# Patient Record
Sex: Male | Born: 1945 | Hispanic: Yes | Marital: Married | Smoking: Never smoker
Health system: Southern US, Community
[De-identification: ages and names within clinical notes are randomized; demographics above are authoritative.]

## PROBLEM LIST (undated history)

## (undated) DIAGNOSIS — K7460 Unspecified cirrhosis of liver: Secondary | ICD-10-CM

## (undated) DIAGNOSIS — E119 Type 2 diabetes mellitus without complications: Secondary | ICD-10-CM

## (undated) DIAGNOSIS — I10 Essential (primary) hypertension: Secondary | ICD-10-CM

## (undated) HISTORY — PX: APPENDECTOMY: SHX54

---

## 2020-08-04 DIAGNOSIS — E1165 Type 2 diabetes mellitus with hyperglycemia: Secondary | ICD-10-CM | POA: Insufficient documentation

## 2020-09-19 ENCOUNTER — Other Ambulatory Visit (HOSPITAL_COMMUNITY): Payer: Self-pay | Admitting: Physician Assistant

## 2020-09-19 DIAGNOSIS — R4182 Altered mental status, unspecified: Secondary | ICD-10-CM

## 2020-09-19 DIAGNOSIS — R2681 Unsteadiness on feet: Secondary | ICD-10-CM

## 2020-12-04 ENCOUNTER — Encounter (HOSPITAL_COMMUNITY): Payer: Self-pay | Admitting: Emergency Medicine

## 2020-12-04 ENCOUNTER — Other Ambulatory Visit: Payer: Self-pay

## 2020-12-04 ENCOUNTER — Ambulatory Visit (HOSPITAL_COMMUNITY)
Admission: EM | Admit: 2020-12-04 | Discharge: 2020-12-04 | Disposition: A | Discharge status: Home / Self Care | Payer: Medicare Other

## 2020-12-04 DIAGNOSIS — R195 Other fecal abnormalities: Secondary | ICD-10-CM

## 2020-12-04 DIAGNOSIS — K529 Noninfective gastroenteritis and colitis, unspecified: Secondary | ICD-10-CM

## 2020-12-04 DIAGNOSIS — I8500 Esophageal varices without bleeding: Secondary | ICD-10-CM | POA: Diagnosis not present

## 2020-12-04 DIAGNOSIS — R110 Nausea: Secondary | ICD-10-CM

## 2020-12-04 DIAGNOSIS — K7460 Unspecified cirrhosis of liver: Secondary | ICD-10-CM

## 2020-12-04 DIAGNOSIS — R197 Diarrhea, unspecified: Secondary | ICD-10-CM

## 2020-12-04 HISTORY — DX: Type 2 diabetes mellitus without complications: E11.9

## 2020-12-04 HISTORY — DX: Unspecified cirrhosis of liver: K74.60

## 2020-12-04 HISTORY — DX: Essential (primary) hypertension: I10

## 2020-12-04 MED ORDER — LOPERAMIDE HCL 2 MG PO CAPS: 2.0000 mg | ORAL_CAPSULE | Freq: Two times a day (BID) | ORAL | 0 refills | Status: DC | PRN

## 2020-12-04 NOTE — ED Triage Notes (Signed)
Pt is present today with dark stools and abdominal pain, diarrhea, nausea that started two days ago

## 2020-12-04 NOTE — ED Provider Notes (Signed)
[facility]   MRN: [id number] DOB: [birth year]  Subjective:   Anthany Thornhill Bradly Bienenstock is a 75 y.o. male presenting for 2 day history of acute onset nausea without vomiting, 3-4 bouts of diarrhea. Patient actually had an accident today at his daughter's home. She noticed dark stool. Was not profuse. Has not happened again. No history of GI bleeding.  Had an endoscopy and colonoscopy in July. Has liver cirrhosis, esophageal varices. Is followed closely by his GI specialist.  Has follow-up at the end of this month.  No chest pain, shortness of breath, fevers, frank bloody stools.  No perianal pain, history of hemorrhoids.  No current facility-administered medications for this encounter.  Current Outpatient Medications:    carvedilol (COREG) 3.125 MG tablet, Take by mouth., Disp: , Rfl:    furosemide (LASIX) 80 MG tablet, Take 1 tablet by mouth daily., Disp: , Rfl:    Insulin Pen Needle (BD PEN NEEDLE NANO U/F) 32G X 4 MM MISC, 1 Units by Misc.(Non-Drug; Combo Route) route daily., Disp: , Rfl:    spironolactone (ALDACTONE) 100 MG tablet, Take 1 tablet by mouth daily., Disp: , Rfl:    Cholecalciferol 25 MCG (1000 UT) capsule, Take by mouth., Disp: , Rfl:    LANTUS SOLOSTAR 100 UNIT/ML Solostar Pen, Inject into the skin., Disp: , Rfl:    PARoxetine (PAXIL) 10 MG tablet, Take 10 mg by mouth daily., Disp: , Rfl:    No Known Allergies  Past Medical History:  Diagnosis Date   Cirrhosis (HCC)    Diabetes mellitus without complication (HCC)    Hypertension      History reviewed. No pertinent surgical history.  No family history on file.  Social History   Tobacco Use   Smoking status: Never   Smokeless tobacco: Never  Vaping Use   Vaping Use: Never used  Substance Use Topics   Alcohol use: Never   Drug use: Never    ROS   Objective:   Vitals: BP 115/73   Pulse 83   Temp (!) 97.5 F (36.4 C)   Resp 17   SpO2 99%   Physical Exam Constitutional:       General: He is not in acute distress.    Appearance: Normal appearance. He is well-developed and normal weight. He is not ill-appearing, toxic-appearing or diaphoretic.  HENT:     Head: Normocephalic and atraumatic.     Right Ear: External ear normal.     Left Ear: External ear normal.     Nose: Nose normal.     Mouth/Throat:     Mouth: Mucous membranes are moist.     Pharynx: Oropharynx is clear.  Eyes:     General: No scleral icterus.       Right eye: No discharge.        Left eye: No discharge.     Extraocular Movements: Extraocular movements intact.     Pupils: Pupils are equal, round, and reactive to light.  Cardiovascular:     Rate and Rhythm: Normal rate and regular rhythm.     Heart sounds: Normal heart sounds. No murmur heard.   No friction rub. No gallop.  Pulmonary:     Effort: Pulmonary effort is normal. No respiratory distress.     Breath sounds: Normal breath sounds. No stridor. No wheezing, rhonchi or rales.  Abdominal:     General: Bowel sounds are normal. There is no distension.     Palpations: Abdomen is soft. There is  no mass.     Tenderness: There is no abdominal tenderness. There is no right CVA tenderness, left CVA tenderness, guarding or rebound.  Musculoskeletal:     Cervical back: Normal range of motion.  Neurological:     Mental Status: He is alert and oriented to person, place, and time.  Psychiatric:        Mood and Affect: Mood normal.        Behavior: Behavior normal.        Thought Content: Thought content normal.        Judgment: Judgment normal.     Assessment and Plan :   PDMP not reviewed this encounter.  1. Colitis   2. Nausea   3. Diarrhea, unspecified type   4. Dark stools   5. Cirrhosis of liver without ascites, unspecified hepatic cirrhosis type (HCC)   6. Esophageal varices without bleeding, unspecified esophageal varices type (HCC)     Discussed possibility of upper GI bleed given his dark stool and recent endoscopy that  showed esophageal varices.  At the time it was seen without bleeding.  Will defer ER visit as patient is hemodynamically stable vital signs and an unremarkable physical exam.  Recommended follow-up with his GI specialist as soon as possible.  They plan on calling their office now. Counseled patient on potential for adverse effects with medications prescribed/recommended today, ER and return-to-clinic precautions discussed, patient verbalized understanding.    Wallis Bamberg, PA-C 12/04/20 1400

## 2020-12-25 ENCOUNTER — Other Ambulatory Visit: Payer: Self-pay

## 2020-12-25 ENCOUNTER — Ambulatory Visit: Payer: Medicare Other | Attending: Family Medicine | Admitting: Family Medicine

## 2020-12-25 ENCOUNTER — Encounter: Payer: Self-pay | Admitting: Family Medicine

## 2020-12-25 VITALS — BP 119/74 | HR 100 | Ht 67.0 in

## 2020-12-25 DIAGNOSIS — R29898 Other symptoms and signs involving the musculoskeletal system: Secondary | ICD-10-CM | POA: Diagnosis not present

## 2020-12-25 DIAGNOSIS — Z23 Encounter for immunization: Secondary | ICD-10-CM | POA: Diagnosis not present

## 2020-12-25 DIAGNOSIS — E1165 Type 2 diabetes mellitus with hyperglycemia: Secondary | ICD-10-CM | POA: Diagnosis not present

## 2020-12-25 DIAGNOSIS — M79604 Pain in right leg: Secondary | ICD-10-CM

## 2020-12-25 DIAGNOSIS — R296 Repeated falls: Secondary | ICD-10-CM

## 2020-12-25 DIAGNOSIS — E119 Type 2 diabetes mellitus without complications: Secondary | ICD-10-CM | POA: Insufficient documentation

## 2020-12-25 DIAGNOSIS — M79605 Pain in left leg: Secondary | ICD-10-CM | POA: Diagnosis not present

## 2020-12-25 DIAGNOSIS — Z794 Long term (current) use of insulin: Secondary | ICD-10-CM

## 2020-12-25 LAB — GLUCOSE, POCT (MANUAL RESULT ENTRY): POC Glucose: 359 mg/dl — AB (ref 70–99)

## 2020-12-25 LAB — POCT GLYCOSYLATED HEMOGLOBIN (HGB A1C): Hemoglobin A1C: 11.8 % — AB (ref 4.0–5.6)

## 2020-12-25 MED ORDER — DULOXETINE HCL 60 MG PO CPEP
60.0000 mg | ORAL_CAPSULE | Freq: Every day | ORAL | 3 refills | Status: DC
  Filled 2020-12-25: qty 30, 30d supply, fill #0
  Filled 2021-01-22: qty 30, 30d supply, fill #1
  Filled 2021-02-28: qty 30, 30d supply, fill #0
  Filled 2021-02-28: qty 30, 30d supply, fill #2
  Filled 2021-04-01: qty 30, 30d supply, fill #1

## 2020-12-25 MED ORDER — LANTUS SOLOSTAR 100 UNIT/ML ~~LOC~~ SOPN
37.0000 [IU] | PEN_INJECTOR | Freq: Every day | SUBCUTANEOUS | 6 refills | Status: DC
  Filled 2020-12-25: qty 9, 24d supply, fill #0
  Filled 2020-12-25: qty 30, 81d supply, fill #0

## 2020-12-25 NOTE — Progress Notes (Signed)
Subjective:  Patient ID: Nicholas Gomez, male    DOB: June 19, 1945  Age: 75 y.o. MRN: [id number]  CC: Establish Care   HPI [facility] Edsel Petrin is a 75 y.o. year old male with a history of liver cirrhosis (followed by [facility] GI), previous alcohol abuse, esophageal varices here to establish care. Previously resided in [place] but relocated to [place] 7 months ago. Accompanied  by daughter.  Interval History: His last visit with the liver clinic was yesterday at [place] [facility] and notes reviewed from care everywhere.   His blood sugars have been around 300 at home; he is on Basaglar 30 units qhs.  Denies presence of hypoglycemia or visual concerns. He complains of pain in his legs describes as cramping pain. He has also been falling a lot. Daughter is unsure of duration of symptoms.  Denies presence of lower back pain. He was brought to [facility] because he has no one to stay with him at home in [place].  Past Medical History:  Diagnosis Date   Cirrhosis ([place])    Diabetes mellitus without complication ([place])    Hypertension     No past surgical history on file.  No family history on file.  No Known Allergies  Outpatient Medications Prior to Visit  Medication Sig Dispense Refill   carvedilol (COREG) 3.125 MG tablet Take by mouth.     Cholecalciferol 25 MCG (1000 UT) capsule Take by mouth.     furosemide (LASIX) 80 MG tablet Take 1 tablet by mouth daily.     Insulin Pen Needle (BD PEN NEEDLE NANO U/F) 32G X 4 MM MISC 1 Units by Misc.(Non-Drug; Combo Route) route daily.     LANTUS SOLOSTAR 100 UNIT/ML Solostar Pen Inject into the skin.     loperamide (IMODIUM) 2 MG capsule Take 1 capsule (2 mg total) by mouth 2 (two) times daily as needed for diarrhea or loose stools. 14 capsule 0   PARoxetine (PAXIL) 10 MG tablet Take 10 mg by mouth daily.     spironolactone (ALDACTONE) 100 MG tablet Take 1 tablet by mouth daily.     No facility-administered medications prior to  visit.     ROS Review of Systems  Constitutional:  Negative for activity change and appetite change.  HENT:  Negative for sinus pressure and sore throat.   Eyes:  Negative for visual disturbance.  Respiratory:  Negative for cough, chest tightness and shortness of breath.   Cardiovascular:  Negative for chest pain and leg swelling.  Gastrointestinal:  Negative for abdominal distention, abdominal pain, constipation and diarrhea.  Endocrine: Negative.   Genitourinary:  Negative for dysuria.  Musculoskeletal:  Negative for joint swelling and myalgias.  Skin:  Negative for rash.  Allergic/Immunologic: Negative.   Neurological:  Positive for weakness. Negative for light-headedness and numbness.  Psychiatric/Behavioral:  Negative for dysphoric mood and suicidal ideas.    Objective:  BP 119/74   Pulse 100   Ht 5\' 7"  (1.702 m)   SpO2 97%   BP/Weight 12/25/2020 [id number]  Systolic BP [id number]  Diastolic BP 74 73      Physical Exam Constitutional:      Appearance: He is well-developed.  Cardiovascular:     Rate and Rhythm: Normal rate.     Heart sounds: Normal heart sounds. No murmur heard. Pulmonary:     Effort: Pulmonary effort is normal.     Breath sounds: Normal breath sounds. No wheezing or rales.  Chest:     Chest  wall: No tenderness.  Abdominal:     General: Bowel sounds are normal. There is no distension.     Palpations: Abdomen is soft. There is no mass.     Tenderness: There is no abdominal tenderness.     Hernia: A hernia (umbilical) is present.     Comments: Ascites  Musculoskeletal:        General: Normal range of motion.     Right lower leg: No edema.     Left lower leg: No edema.  Neurological:     Mental Status: He is alert and oriented to person, place, and time.     Gait: Gait normal.     Comments: Normal strength in bilateral lower extremities  Psychiatric:        Mood and Affect: Mood normal.    Lab Results  Component Value Date   HGBA1C 11.8  (A) 12/25/2020    Assessment & Plan:  1. Type 2 diabetes mellitus with hyperglycemia, with long-term current use of insulin (HCC) Uncontrolled with A1c of 11.8 Increase Lantus from 30 units to 37 units Will review blood sugar log at next visit and adjust regimen accordingly - POCT glucose (manual entry) - POCT glycosylated hemoglobin (Hb A1C) - insulin glargine (LANTUS SOLOSTAR) 100 UNIT/ML Solostar Pen; Inject 37 Units into the skin at bedtime.  Dispense: 30 mL; Refill: 6  2. 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 For leg pains  Dispense: 30 capsule; Refill: 3  3. Weakness of both lower extremities Underlying myopathy Will benefit from PT  4. Falls frequently - Ambulatory referral to Physical Therapy  5. Need for pneumococcal vaccine - Pneumococcal conjugate vaccine 20-valent  6. Need for immunization against influenza - Flu Vaccine QUAD 62mo+IM (Fluarix, Fluzone & Alfiuria Quad PF)    No orders of the defined types were placed in this encounter.  Return in about 6 weeks (around 02/05/2021) for Diabetes.       Hoy Register, MD, FAAFP. [facility] [place], [place] [phone]   12/25/2020, 2:19 PM

## 2020-12-25 NOTE — Progress Notes (Signed)
[place] [id number]

## 2020-12-31 ENCOUNTER — Telehealth: Payer: Self-pay

## 2020-12-31 ENCOUNTER — Other Ambulatory Visit: Payer: Self-pay

## 2020-12-31 NOTE — Telephone Encounter (Signed)
Yes that is being prescribed by his GI at [facility] and he needs to clarify with them.

## 2020-12-31 NOTE — Telephone Encounter (Signed)
Pt is wanting to know if he is to still take Lasix and the dosage.

## 2021-01-01 ENCOUNTER — Other Ambulatory Visit: Payer: Self-pay

## 2021-01-01 NOTE — Telephone Encounter (Signed)
Pt was called and informed to contact gastro doctor for medication dosage.

## 2021-01-03 ENCOUNTER — Other Ambulatory Visit: Payer: Self-pay

## 2021-01-22 ENCOUNTER — Other Ambulatory Visit: Payer: Self-pay

## 2021-01-22 DIAGNOSIS — K7031 Alcoholic cirrhosis of liver with ascites: Secondary | ICD-10-CM | POA: Insufficient documentation

## 2021-01-22 MED ORDER — FUROSEMIDE 80 MG PO TABS
ORAL_TABLET | ORAL | 5 refills | Status: DC
  Filled 2021-01-22 – 2021-03-13 (×3): qty 60, 30d supply, fill #0
  Filled 2021-04-12: qty 60, 30d supply, fill #1
  Filled 2021-04-15: qty 60, 30d supply, fill #0
  Filled 2021-05-20: qty 60, 30d supply, fill #1
  Filled 2021-06-20 (×2): qty 60, 30d supply, fill #2
  Filled 2021-08-15: qty 60, 30d supply, fill #3

## 2021-01-23 ENCOUNTER — Other Ambulatory Visit: Payer: Self-pay

## 2021-01-24 ENCOUNTER — Other Ambulatory Visit: Payer: Self-pay

## 2021-01-25 ENCOUNTER — Other Ambulatory Visit: Payer: Self-pay

## 2021-01-30 ENCOUNTER — Other Ambulatory Visit: Payer: Self-pay

## 2021-01-30 ENCOUNTER — Ambulatory Visit: Payer: Medicare Other | Attending: Family Medicine | Admitting: Physical Therapy

## 2021-01-30 DIAGNOSIS — R262 Difficulty in walking, not elsewhere classified: Secondary | ICD-10-CM

## 2021-01-30 DIAGNOSIS — R296 Repeated falls: Secondary | ICD-10-CM

## 2021-01-30 DIAGNOSIS — R260 Ataxic gait: Secondary | ICD-10-CM | POA: Diagnosis present

## 2021-01-30 DIAGNOSIS — M6281 Muscle weakness (generalized): Secondary | ICD-10-CM

## 2021-01-30 NOTE — Therapy (Signed)
[facility] [address]  [address] [place], [place], [address] Phone: [phone]   Fax:  [phone]  Physical Therapy Evaluation  Patient Details  Name: Nicholas Gomez MRN: [id number] Date of Birth: 08-24-1945 Referring Provider (PT): Hoy Register, MD   Encounter Date: 01/30/2021   PT End of Session - 01/30/21 1731     Visit Number 1    Number of Visits 16    Date for PT Re-Evaluation 03/27/21    Authorization Type Medicaid    Progress Note Due on Visit 10    PT Start Time 1550    PT Stop Time 1625    PT Time Calculation (min) 35 min    Equipment Utilized During Treatment Gait belt    Activity Tolerance Patient tolerated treatment well    Behavior During Therapy Nicholas Gomez for tasks assessed/performed;Impulsive             Past Medical History:  Diagnosis Date   Cirrhosis (HCC)    Diabetes mellitus without complication (HCC)    Hypertension     No past surgical history on file.  There were no vitals filed for this visit.    Subjective Assessment - 01/30/21 1552     Subjective Patient is here with his family today (daughter and daughter in law).  He fell yesterday and hit his head.   The cymbalta has helped with leg pain.  He feels very dizzy, has CT scan scheduled to check brain.    Patient is accompained by: Family member    Pertinent History history of liver cirrhosis, EtOH abuse, esophageal varices, Diabetes, HTN, umbilical hernia    Patient Stated Goals improve balance, stop falling    Currently in Pain? Yes    Pain Score 2     Pain Location Head                [facility] PT Assessment - 01/30/21 0001       Assessment   Medical Diagnosis R29.6 (ICD-10-CM) - Falls frequently    Referring Provider (PT) Hoy Register, MD    Next MD Visit 02/06/2021    Prior Therapy no      Precautions   Precautions Fall      Restrictions   Weight Bearing Restrictions No      Balance Screen   Has the  patient fallen in the past 6 months Yes    How many times? falls frequently, including yesterday    Has the patient had a decrease in activity level because of a fear of falling?  Yes    Is the patient reluctant to leave their home because of a fear of falling?  Yes      Home Environment   Living Environment Private residence    Living Arrangements Children   adult children     Prior Function   Level of Independence Independent    Vocation Retired      IT consultant   Overall Cognitive Status Impaired/Different from baseline    Area of Impairment Safety/judgement    Safety/Judgement Decreased awareness of safety;Decreased awareness of deficits    Behaviors Impulsive      Observation/Other Assessments   Observations Pt. enters today with daughter and daughter in law who provided translation.  They declined offer of medical translation.  He had SPC which used very inconsistently, gait was very unsteady, especially with turning.  Appeared alert and would answer questions and follow directions, but also very impulsive.  ROM / Strength   AROM / PROM / Strength Strength      Strength   Overall Strength Within functional limits for tasks performed    Overall Strength Comments 5/5 bil LE strength, tested in sitting      Ambulation/Gait   Ambulation/Gait Yes    Ambulation/Gait Assistance 4: Min assist    Ambulation/Gait Assistance Details with 4WRW and minA using gait belt, frequent path deviations    Ambulation Distance (Feet) 800 Feet    Assistive device 4-wheeled walker    Gait Pattern Ataxic    Ambulation Surface Level      6 minute walk test results    Aerobic Endurance Distance Walked 800    Endurance additional comments no DOE or signs of exertion, no complaint of fatigue      Balance   Balance Assessed Yes      Standardized Balance Assessment   Standardized Balance Assessment Timed Up and Go Test;Five Times Sit to Stand    Five times sit to stand comments  26 seconds  with UE support and SBA for safety      Timed Up and Go Test   TUG Normal TUG    Normal TUG (seconds) 26    TUG Comments modA needed to prevent fall with turning                        Objective measurements completed on examination: See above findings.                  PT Short Term Goals - 01/30/21 1743       PT SHORT TERM GOAL #1   Title Pt. will complete FOTO for balance    Time 1    Period Weeks    Status New    Target Date 02/06/21               PT Long Term Goals - 01/30/21 1744       PT LONG TERM GOAL #1   Title Pt. will be compliant with progressive HEP for LE strengthening/balance.    Time 8    Period Weeks    Status New    Target Date 03/27/21      PT LONG TERM GOAL #2   Title Pt. will demonstrate improved functional strength by completing 5x STS in <20 second safely.    Baseline 26 seconds with UE assist.    Time 8    Period Weeks    Status New    Target Date 03/27/21      PT LONG TERM GOAL #3   Title Pt. will be able to complete TUG safely in <25 seconds with appropriate AD and no LOB.    Baseline 22 seconds, however modA needed to prevent falls.  Pt. with poor awarenes of balance deficits.    Time 8    Period Weeks    Status New    Target Date 03/27/21      PT LONG TERM GOAL #4   Title Pt. will be complaint with use of appropriate AD and ambulate 100' safely with supervision.    Baseline inconsistent with AD use, ataxic, CGA needed for safety    Time 8    Period Weeks    Status New    Target Date 03/27/21                    Plan - 01/30/21 1732  Clinical Impression Statement [name] is a 75 year old male with significant medical history for liver cirrhosis, EtOH abuse, esophageal varices, Diabetes, HTN, and umbilical hernia, referred for frequent falls and deconditioning.  He has recently moved to area from [place] and accompanied by daughter and daughter in law today who provided  translation.  He fell yesterday and hit his head, but reports the pain is not too bad.  His leg pain has improved significantly with Cymbalta and is not a concern.  Today he demonstrates very ataxic gait and needed CGA at all times with gait belt to prevent falls.  He does not appear aware of his impairments, and also very impulsive, for example deciding to demonstrate to therapist exacty how he fell yesterday (tends to fall backwards when standing).  His LE strength is good, and today he was able to walk for 6 minutes without discomfort or any signs of exertion.  His 5x STS and TUG score place him at high risk for falls.  He would benefit from skilled physical therapy to improve balance and decrease fall risk.  Due to his fall risk, he would benefit from 24 hour supervision and 4WRW.  Family is working on obtaining [id number].  He has 2WRW at home but does not use and is not safe with it.  Issued gait belt today also and encouraged use of gait belt to patient especially walking outside to Dr. appt. with family, both for their safety and his.  Family would benefit from referral to social work, as daughter reports caregiver fatigue and difficulty with providing supervision.  Due to time (family arrived 15 min late to evaluation), was unable to assess dizziness today, but will monitor and further assess next session.  He also has brain CT scan scheduled for dizziness.    Personal Factors and Comorbidities Comorbidity 3+;Social Background;Age;Behavior Pattern    Comorbidities history of liver cirrhosis, EtOH abuse, esophageal varices, Diabetes, HTN, umbilical hernia    Examination-Activity Limitations Transfers;Bend;Lift;Locomotion Level;Stairs;Stand    Examination-Participation Restrictions Community Activity;Meal Prep;Shop;Cleaning;Laundry;Yard Work    Conservation officer, historic buildings Evolving/Moderate complexity    Clinical Decision Making Moderate    Rehab Potential Fair    PT Frequency 2x / week    PT  Duration 8 weeks    PT Treatment/Interventions ADLs/Self Care Home Management;Gait training;Stair training;Functional mobility training;Therapeutic activities;Therapeutic exercise;Balance training;Neuromuscular re-education;Patient/family education;Cryotherapy;Moist Heat;Vestibular    PT Next Visit Plan FOTO for balance, assess vestibular system/dizziness, mCTSIB, balance training    Recommended Other Services social work    [employer] and Agree with Plan of Care Patient;Family member/caregiver    Family Member Consulted daughter and daughter in law             Patient will benefit from skilled therapeutic intervention in order to improve the following deficits and impairments:  Abnormal gait, Decreased balance, Decreased mobility, Difficulty walking, Decreased knowledge of precautions, Dizziness, Pain, Decreased safety awareness, Decreased activity tolerance  Visit Diagnosis: Repeated falls  Ataxic gait  Difficulty in walking, not elsewhere classified  Muscle weakness (generalized)     Problem List Patient Active Problem List   Diagnosis Date Noted   Diabetes mellitus without complication (HCC)     Jena Gauss, PT, DPT  01/30/2021, 5:50 PM  [facility] [address]  [address] [place], [place], [address] Phone: [phone]   Fax:  [phone]  Name: Nicholas Gomez MRN: [id number] Date of Birth: [birth year]

## 2021-01-31 ENCOUNTER — Other Ambulatory Visit: Payer: Self-pay | Admitting: Family Medicine

## 2021-01-31 MED ORDER — MISC. DEVICES MISC: 0 refills | Status: DC

## 2021-02-04 ENCOUNTER — Ambulatory Visit: Payer: Medicare Other

## 2021-02-04 ENCOUNTER — Other Ambulatory Visit: Payer: Self-pay

## 2021-02-04 DIAGNOSIS — R262 Difficulty in walking, not elsewhere classified: Secondary | ICD-10-CM

## 2021-02-04 DIAGNOSIS — R260 Ataxic gait: Secondary | ICD-10-CM

## 2021-02-04 DIAGNOSIS — R296 Repeated falls: Secondary | ICD-10-CM | POA: Diagnosis not present

## 2021-02-04 DIAGNOSIS — M6281 Muscle weakness (generalized): Secondary | ICD-10-CM

## 2021-02-04 NOTE — Therapy (Signed)
[facility] [address]  [address] [place], [place], [address] Phone: [phone]   Fax:  [phone]  Physical Therapy Treatment  Patient Details  Name: Nicholas Gomez MRN: [id number] Date of Birth: [birth year] Referring Provider (PT): Hoy Register, MD   Encounter Date: 02/04/2021   PT End of Session - 02/04/21 1726     Visit Number 2    Number of Visits 16    Date for PT Re-Evaluation 03/27/21    Authorization Type Medicaid    Progress Note Due on Visit 10    PT Start Time 1535    PT Stop Time 1614    PT Time Calculation (min) 39 min    Equipment Utilized During Treatment Gait belt    Activity Tolerance Patient tolerated treatment well;Patient limited by pain    Behavior During Therapy [facility] for tasks assessed/performed;Impulsive             Past Medical History:  Diagnosis Date   Cirrhosis (HCC)    Diabetes mellitus without complication (HCC)    Hypertension     History reviewed. No pertinent surgical history.  There were no vitals filed for this visit.   Subjective Assessment - 02/04/21 1540     Subjective Pt reports that he gets dizzy at times when trying to stand up. Has just got a rollator due to frequent falls.    Pertinent History history of liver cirrhosis, EtOH abuse, esophageal varices, Diabetes, HTN, umbilical hernia    Patient Stated Goals improve balance, stop falling    Currently in Pain? Yes    Pain Score 10-Worst pain ever    Pain Location Hip    Pain Orientation Right    Pain Descriptors / Indicators Aching    Pain Type Acute pain                [facility] PT Assessment - 02/04/21 0001       Observation/Other Assessments   Focus on Therapeutic Outcomes (FOTO)  Balance: 34.6                           [facility] Adult PT Treatment/Exercise - 02/04/21 0001       Ambulation/Gait   Ambulation/Gait Yes    Ambulation/Gait Assistance 4: Min assist    Ambulation  Distance (Feet) 150 Feet    Assistive device 4-wheeled walker    Gait Pattern Ataxic      Exercises   Exercises Knee/Hip      Knee/Hip Exercises: Standing   Hip Abduction AROM;Both;10 reps;Knee straight    Hip Extension AROM;Both;10 reps;Knee straight    Extension Limitations marches with counter support 10x      Knee/Hip Exercises: Seated   Marching AROM;Both;10 reps                       PT Short Term Goals - 02/04/21 1735       PT SHORT TERM GOAL #1   Title Pt. will complete FOTO for balance    Time 1    Period Weeks    Status Achieved    Target Date 02/06/21               PT Long Term Goals - 02/04/21 1735       PT LONG TERM GOAL #1   Title Pt. will be compliant with progressive HEP for LE strengthening/balance.    Time 8  Period Weeks    Status On-going    Target Date 03/27/21      PT LONG TERM GOAL #2   Title Pt. will demonstrate improved functional strength by completing 5x STS in <20 second safely.    Baseline 26 seconds with UE assist.    Time 8    Period Weeks    Status On-going    Target Date 03/27/21      PT LONG TERM GOAL #3   Title Pt. will be able to complete TUG safely in <25 seconds with appropriate AD and no LOB.    Baseline 22 seconds, however modA needed to prevent falls.  Pt. with poor awarenes of balance deficits.    Time 8    Period Weeks    Status On-going    Target Date 03/27/21      PT LONG TERM GOAL #4   Title Pt. will be complaint with use of appropriate AD and ambulate 100' safely with supervision.    Baseline inconsistent with AD use, ataxic, CGA needed for safety    Time 8    Period Weeks    Status On-going    Target Date 03/27/21                   Plan - 02/04/21 1727     Clinical Impression Statement Pt arrived to clinic with rollator and daughter in law who provided translation. I reviewed basic safety with rollator making sure to lock brakes and not to brace onto walker when standing.  Constant cues were given to pace himself while walking with the rollator as he tends to become unsteady. Increased time required with interventions and due to patient's cognition. Cues with every exercise for proper form and to prevent compensation. Provided a lot of home education this session.    Personal Factors and Comorbidities Comorbidity 3+;Social Background;Age;Behavior Pattern    Comorbidities history of liver cirrhosis, EtOH abuse, esophageal varices, Diabetes, HTN, umbilical hernia    Examination-Activity Limitations Transfers;Bend;Lift;Locomotion Level;Stairs;Stand    Examination-Participation Restrictions Community Activity;Meal Prep;Shop;Cleaning;Laundry;Yard Work    Conservation officer, historic buildings Evolving/Moderate complexity    Rehab Potential Fair    PT Frequency 2x / week    PT Duration 8 weeks    PT Treatment/Interventions ADLs/Self Care Home Management;Gait training;Stair training;Functional mobility training;Therapeutic activities;Therapeutic exercise;Balance training;Neuromuscular re-education;Patient/family education;Cryotherapy;Moist Heat;Vestibular    PT Next Visit Plan assess vestibular system/dizziness, mCTSIB, balance training    Consulted and Agree with Plan of Care Patient;Family member/caregiver    Family Member Consulted daughter and daughter in law             Patient will benefit from skilled therapeutic intervention in order to improve the following deficits and impairments:  Abnormal gait, Decreased balance, Decreased mobility, Difficulty walking, Decreased knowledge of precautions, Dizziness, Pain, Decreased safety awareness, Decreased activity tolerance  Visit Diagnosis: Repeated falls  Ataxic gait  Difficulty in walking, not elsewhere classified  Muscle weakness (generalized)     Problem List Patient Active Problem List   Diagnosis Date Noted   Diabetes mellitus without complication (HCC)     Darleene Cleaver, PTA 02/04/2021, 5:36  PM  [facility] [address]  [address] [place], [place], [address] Phone: [phone]   Fax:  [phone]  Name: Nicholas Gomez MRN: [id number] Date of Birth: 1945/11/06

## 2021-02-06 ENCOUNTER — Encounter: Payer: Self-pay | Admitting: Family Medicine

## 2021-02-06 ENCOUNTER — Ambulatory Visit: Payer: Medicare Other

## 2021-02-06 ENCOUNTER — Other Ambulatory Visit: Payer: Self-pay

## 2021-02-06 ENCOUNTER — Ambulatory Visit: Payer: Medicare Other | Attending: Family Medicine | Admitting: Family Medicine

## 2021-02-06 VITALS — BP 131/77 | HR 84 | Resp 16 | Wt 169.0 lb

## 2021-02-06 DIAGNOSIS — R260 Ataxic gait: Secondary | ICD-10-CM

## 2021-02-06 DIAGNOSIS — R296 Repeated falls: Secondary | ICD-10-CM

## 2021-02-06 DIAGNOSIS — K7469 Other cirrhosis of liver: Secondary | ICD-10-CM | POA: Diagnosis not present

## 2021-02-06 DIAGNOSIS — Z794 Long term (current) use of insulin: Secondary | ICD-10-CM

## 2021-02-06 DIAGNOSIS — M6281 Muscle weakness (generalized): Secondary | ICD-10-CM

## 2021-02-06 DIAGNOSIS — R262 Difficulty in walking, not elsewhere classified: Secondary | ICD-10-CM

## 2021-02-06 DIAGNOSIS — E1165 Type 2 diabetes mellitus with hyperglycemia: Secondary | ICD-10-CM

## 2021-02-06 LAB — GLUCOSE, POCT (MANUAL RESULT ENTRY)
POC Glucose: 359 mg/dl — AB (ref 70–99)
POC Glucose: 406 mg/dl — AB (ref 70–99)

## 2021-02-06 MED ORDER — BASAGLAR KWIKPEN 100 UNIT/ML ~~LOC~~ SOPN
PEN_INJECTOR | SUBCUTANEOUS | 6 refills | Status: DC
  Filled 2021-02-06 – 2021-04-15 (×2): qty 30, 66d supply, fill #0
  Filled 2021-08-07: qty 15, 30d supply, fill #1

## 2021-02-06 MED ORDER — INSULIN ASPART 100 UNIT/ML IJ SOLN
8.0000 [IU] | Freq: Once | INTRAMUSCULAR | Status: AC
Start: 2021-02-06 — End: 2021-02-06
  Administered 2021-02-06: 17:00:00 8 [IU] via SUBCUTANEOUS

## 2021-02-06 NOTE — Patient Instructions (Signed)
Diabetes mellitus y nutricin, en adultos Diabetes Mellitus and Nutrition, Adult Si sufre de diabetes, o diabetes mellitus, es muy importante tener hbitos alimenticios saludables debido a que sus niveles de azcar en la sangre (glucosa) se ven afectados en gran medida por lo que come y bebe. Comer alimentos saludables en las cantidades correctas, aproximadamente a la misma hora todos los das, lo ayudar a: Controlar su glucemia. Disminuir el riesgo de sufrir una enfermedad cardaca. Mejorar la presin arterial. Alcanzar o mantener un peso saludable. Qu puede afectar mi plan de alimentacin? Todas las personas que sufren de diabetes son diferentes y cada una tiene necesidades diferentes en cuanto a un plan de alimentacin. El mdico puede recomendarle que trabaje con un nutricionista para elaborar el mejor plan para usted. Su plan de alimentacin puede variar segn factores como: Las caloras que necesita. Los medicamentos que toma. Su peso. Sus niveles de glucemia, presin arterial y colesterol. Su nivel de actividad. Otras afecciones que tenga, como enfermedades cardacas o renales. Cmo me afectan los carbohidratos? Los carbohidratos, o hidratos de carbono, afectan su nivel de glucemia ms que cualquier otro tipo de alimento. La ingesta de carbohidratos aumenta la cantidad de glucosa en la sangre. Es importante conocer la cantidad de carbohidratos que se pueden ingerir en cada comida sin correr ningn riesgo. Esto es diferente en cada persona. Su nutricionista puede ayudarlo a calcular la cantidad de carbohidratos que debe ingerir en cada comida y en cada refrigerio. Cmo me afecta el alcohol? El alcohol puede provocar una disminucin de la glucemia (hipoglucemia), especialmente si usa insulina o toma determinados medicamentos por va oral para la diabetes. La hipoglucemia es una afeccin potencialmente mortal. Los sntomas de la hipoglucemia, como somnolencia, mareos y confusin, son  similares a los sntomas de haber consumido demasiado alcohol. No beba alcohol si: Su mdico le indica no hacerlo. Est embarazada, puede estar embarazada o est tratando de quedar embarazada. Si bebe alcohol: Limite la cantidad que bebe a lo siguiente: De 0 a 1 medida por da para las mujeres. De 0 a 2 medidas por da para los hombres. Sepa cunta cantidad de alcohol hay en las bebidas que toma. En los [place], una medida equivale a una botella de cerveza de 12 oz (355 ml), un vaso de vino de 5 oz (148 ml) o un vaso de una bebida alcohlica de alta graduacin de 1 oz (44 ml). Mantngase hidratado bebiendo agua, refrescos dietticos o t helado sin azcar. Tenga en cuenta que los refrescos comunes, los jugos y otras bebidas para mezclar pueden contener mucha azcar y se deben contar como carbohidratos. Consejos para seguir este plan Leer las etiquetas de los alimentos Comience por leer el tamao de la porcin en la etiqueta de Informacin nutricional de los alimentos envasados y las bebidas. La cantidad de caloras, carbohidratos, grasas y otros nutrientes detallados en la etiqueta se basan en una porcin del alimento. Muchos alimentos contienen ms de una porcin por envase. Verifique la cantidad total de gramos (g) de carbohidratos totales en una porcin. Verifique la cantidad de gramos de grasas saturadas y grasas trans en una porcin. Escoja alimentos que no contengan estas grasas o que su contenido de estas sea bajo. Verifique la cantidad de miligramos (mg) de sal (sodio) en una porcin. La mayora de las personas deben limitar la ingesta de sodio total a menos de 2300 mg por da. Siempre consulte la informacin nutricional de los alimentos etiquetados como "con bajo contenido de grasa" o "sin grasa". Estos   alimentos pueden tener un mayor contenido de azcar agregada o carbohidratos refinados, y deben evitarse. Hable con su nutricionista para identificar sus objetivos diarios en cuanto  a los nutrientes mencionados en la etiqueta. Al ir de compras Evite comprar alimentos procesados, enlatados o precocidos. Estos alimentos tienden a tener una mayor cantidad de grasa, sodio y azcar agregada. Compre en la zona exterior de la tienda de comestibles. Esta es la zona donde se encuentran con mayor frecuencia las frutas y las verduras frescas, los cereales a granel, las carnes frescas y los productos lcteos frescos. Al cocinar Use mtodos de coccin a baja temperatura, como hornear, en lugar de mtodos de coccin a alta temperatura, como frer en abundante aceite. Cocine con aceites saludables, como el aceite de oliva, canola o girasol. Evite cocinar con manteca, crema o carnes con alto contenido de grasa. Planificacin de las comidas Coma las comidas y los refrigerios regularmente, preferentemente a la misma hora todos los das. Evite pasar largos perodos de tiempo sin comer. Consuma alimentos ricos en fibra, como frutas frescas, verduras, frijoles y cereales integrales. Consuma entre 4 y 6 onzas (entre 112 y 168 g) de protenas magras por da, como carnes magras, pollo, pescado, huevos o tofu. Una onza (oz) (28 g) de protena magra equivale a: 1 onza (28 g) de carne, pollo o pescado. 1 huevo.  taza (62 g) de tofu. Coma algunos alimentos por da que contengan grasas saludables, como aguacates, frutos secos, semillas y pescado. Qu alimentos debo comer? Frutas Bayas. Manzanas. Naranjas. Duraznos. Damascos. Ciruelas. Uvas. Mangos. Papayas. Granadas. Kiwi. Cerezas. Verduras Verduras de hoja verde, que incluyen lechuga, espinaca, col rizada, acelga, hojas de berza, hojas de mostaza y repollo. Remolachas. Coliflor. Brcoli. Zanahorias. Judas verdes. Tomates. Pimientos. Cebollas. Pepinos. Coles de Bruselas. Granos Granos integrales, como panes, galletas, tortillas, cereales y pastas de salvado o integrales. Avena sin azcar. Quinua. Arroz integral o salvaje. Carnes y otras  protenas Frutos de mar. Carne de ave sin piel. Cortes magros de ave y carne de res. Tofu. Frutos secos. Semillas. Lcteos Productos lcteos sin grasa o con bajo contenido de grasa, como leche, yogur y queso. Es posible que los productos detallados arriba no constituyan una lista completa de los alimentos y las bebidas que puede tomar. Consulte a un nutricionista para obtener ms informacin. Qu alimentos debo evitar? Frutas Frutas enlatadas al almbar. Verduras Verduras enlatadas. Verduras congeladas con mantequilla o salsa de crema. Granos Productos elaborados con harina y harina blanca refinada, como panes, pastas, bocadillos y cereales. Evite todos los alimentos procesados. Carnes y otras protenas Cortes de carne con alto contenido de grasa. Carne de ave con piel. Carnes empanizadas o fritas. Carne procesada. Evite las grasas saturadas. Lcteos Yogur, queso o leche enteros. Bebidas Bebidas azucaradas, como gaseosas o t helado. Es posible que los productos que se enumeran ms arriba no constituyan una lista completa de los alimentos y las bebidas que debe evitar. Consulte a un nutricionista para obtener ms informacin. Preguntas para hacerle al mdico Debo consultar con un especialista certificado en atencin y educacin sobre la diabetes? Es necesario que me rena con un nutricionista? A qu nmero puedo llamar si tengo preguntas? Cules son los mejores momentos para controlar la glucemia? Dnde encontrar ms informacin: American Diabetes Association (Asociacin Estadounidense de la Diabetes): diabetes.org Academy of Nutrition and Dietetics (Academia de Nutricin y Diettica): eatright.org National Institute of Diabetes and Digestive and Kidney Diseases (Instituto Nacional de la Diabetes y las Enfermedades Digestivas y Renales): niddk.nih.gov Association of Diabetes Care &   Education Specialists (Asociacin de Especialistas en Atencin y Educacin sobre la Diabetes):  diabeteseducator.org Resumen Es importante tener hbitos alimenticios saludables debido a que sus niveles de azcar en la sangre (glucosa) se ven afectados en gran medida por lo que come y bebe. Es importante consumir alcohol con prudencia. Un plan de comidas saludable lo ayudar a controlar la glucosa en sangre y a reducir el riesgo de enfermedades cardacas. El mdico puede recomendarle que trabaje con un nutricionista para elaborar el mejor plan para usted. Esta informacin no tiene como fin reemplazar el consejo del mdico. Asegrese de hacerle al mdico cualquier pregunta que tenga. Document Revised: 10/19/2019 Document Reviewed: 10/19/2019 Elsevier Patient Education  2022 Elsevier Inc.  

## 2021-02-06 NOTE — Therapy (Signed)
[facility] [address]  [address] [place], [place], [address] Phone: [phone]   Fax:  [phone]  Physical Therapy Treatment  Patient Details  Name: Nicholas Gomez MRN: [id number] Date of Birth: Dec 30, 1945 Referring Provider (PT): Hoy Register, MD   Encounter Date: 02/06/2021   PT End of Session - 02/06/21 1201     Visit Number 3    Number of Visits 16    Date for PT Re-Evaluation 03/27/21    Authorization Type Medicaid    Progress Note Due on Visit 10    PT Start Time 1017    PT Stop Time 1058    PT Time Calculation (min) 41 min    Equipment Utilized During Treatment Gait belt    Activity Tolerance Patient tolerated treatment well;Patient limited by pain    Behavior During Therapy [facility] for tasks assessed/performed;Impulsive             Past Medical History:  Diagnosis Date   Cirrhosis (HCC)    Diabetes mellitus without complication (HCC)    Hypertension     History reviewed. No pertinent surgical history.  There were no vitals filed for this visit.   Subjective Assessment - 02/06/21 1022     Subjective Pt reports that he continues to have R sided lower back pain, has not fallen since he has had his rollator.    Patient is accompained by: Family member;Interpreter    Pertinent History history of liver cirrhosis, EtOH abuse, esophageal varices, Diabetes, HTN, umbilical hernia    Patient Stated Goals improve balance, stop falling    Currently in Pain? Yes    Pain Score 10-Worst pain ever    Pain Location Hip    Pain Orientation Right    Pain Descriptors / Indicators Aching    Pain Type Acute pain                               [facility] Adult PT Treatment/Exercise - 02/06/21 0001       Ambulation/Gait   Ambulation/Gait Yes    Ambulation/Gait Assistance 4: Min assist;3: Mod assist    Ambulation Distance (Feet) 90 Feet    Assistive device 4-wheeled walker    Gait Pattern  Ataxic      Exercises   Exercises Knee/Hip      Knee/Hip Exercises: Standing   Heel Raises Both;10 reps;2 seconds    Heel Raises Limitations counter support    Hip Abduction AROM;Both;5 reps;Knee straight    Other Standing Knee Exercises marches with counter support 10 reps      Knee/Hip Exercises: Seated   Long Arc Quad AROM;Both;10 reps;2 sets    Clamshell with TheraBand Red   10x2"   Marching AROM;Both;10 reps;2 sets                     PT Education - 02/06/21 1203     Education Details HEP update: access code: [email or web address]    Person(s) Educated Patient;Child(ren)    Methods Explanation;Demonstration;Tactile cues;Verbal cues;Handout    Comprehension Verbalized understanding;Returned demonstration;Verbal cues required;Tactile cues required;Need further instruction              PT Short Term Goals - 02/04/21 1735       PT SHORT TERM GOAL #1   Title Pt. will complete FOTO for balance    Time 1    Period Weeks    Status  Achieved    Target Date 02/06/21               PT Long Term Goals - 02/04/21 1735       PT LONG TERM GOAL #1   Title Pt. will be compliant with progressive HEP for LE strengthening/balance.    Time 8    Period Weeks    Status On-going    Target Date 03/27/21      PT LONG TERM GOAL #2   Title Pt. will demonstrate improved functional strength by completing 5x STS in <20 second safely.    Baseline 26 seconds with UE assist.    Time 8    Period Weeks    Status On-going    Target Date 03/27/21      PT LONG TERM GOAL #3   Title Pt. will be able to complete TUG safely in <25 seconds with appropriate AD and no LOB.    Baseline 22 seconds, however modA needed to prevent falls.  Pt. with poor awarenes of balance deficits.    Time 8    Period Weeks    Status On-going    Target Date 03/27/21      PT LONG TERM GOAL #4   Title Pt. will be complaint with use of appropriate AD and ambulate 100' safely with supervision.    Baseline  inconsistent with AD use, ataxic, CGA needed for safety    Time 8    Period Weeks    Status On-going    Target Date 03/27/21                   Plan - 02/06/21 1135     Clinical Impression Statement Pt was a little limited by some shooting pain in his R lower back while walking today. He still continues to note that his pain is bad when he first comes in but with exercises his pain eases up. Provided education for him and his daughter to make sure he keeps his walker with him at all times. Added seated ther ex to HEP. He fairly tolerated the exercises but needed extensive cues for proper form and increased time for understanding due to cognition.    Personal Factors and Comorbidities Comorbidity 3+;Social Background;Age;Behavior Pattern    Comorbidities history of liver cirrhosis, EtOH abuse, esophageal varices, Diabetes, HTN, umbilical hernia    Examination-Activity Limitations Transfers;Bend;Lift;Locomotion Level;Stairs;Stand    Examination-Participation Restrictions Community Activity;Meal Prep;Shop;Cleaning;Laundry;Yard Work    Conservation officer, historic buildings Evolving/Moderate complexity    Rehab Potential Fair    PT Frequency 2x / week    PT Duration 8 weeks    PT Treatment/Interventions ADLs/Self Care Home Management;Gait training;Stair training;Functional mobility training;Therapeutic activities;Therapeutic exercise;Balance training;Neuromuscular re-education;Patient/family education;Cryotherapy;Moist Heat;Vestibular    PT Next Visit Plan assess vestibular system/dizziness, mCTSIB, balance training/hip strengthening    Consulted and Agree with Plan of Care Patient;Family member/caregiver    Family Member Consulted daughter and daughter in law             Patient will benefit from skilled therapeutic intervention in order to improve the following deficits and impairments:  Abnormal gait, Decreased balance, Decreased mobility, Difficulty walking, Decreased knowledge of  precautions, Dizziness, Pain, Decreased safety awareness, Decreased activity tolerance  Visit Diagnosis: Repeated falls  Ataxic gait  Difficulty in walking, not elsewhere classified  Muscle weakness (generalized)     Problem List Patient Active Problem List   Diagnosis Date Noted   Diabetes mellitus without complication (HCC)  Darleene Cleaver, PTA 02/06/2021, 12:06 PM  [facility] [address]  [address] [place], [place], [address] Phone: [phone]   Fax:  [phone]  Name: Nicholas Gomez MRN: [id number] Date of Birth: Nov 16, 1945

## 2021-02-06 NOTE — Progress Notes (Signed)
Subjective:  Patient ID: Nicholas Gomez, male    DOB: Nov 07, 1945  Age: 75 y.o. MRN: [id number]  CC: Bloated and Abdominal Pain   HPI Nicholas Gomez Nicholas Gomez is a 75 y.o. year old male with a history of liver cirrhosis (followed by [facility]), previous alcohol abuse, esophageal varices here to establish care. Previously resided in [place] but relocated to [place]. Accompanied  by daughter.  Interval History: He is undergoing PT and is doing better. He is still falling though. Currently using a walker. He complains of abdominal bloating and pain.  Does not see GI at [facility] for the next 2 months.  On further questioning he denies presence of dyspnea.  His sugars are still elevated. In the morning he has sugars at 117. One day he had a random of >500 around 5pm. He administers 37 units qhs consistently which his daughter administers.  Today in the clinic his CBG is 406. Past Medical History:  Diagnosis Date   Cirrhosis ([place])    Diabetes mellitus without complication ([place])    Hypertension     No past surgical history on file.  No family history on file.  No Known Allergies  Outpatient Medications Prior to Visit  Medication Sig Dispense Refill   carvedilol (COREG) 3.125 MG tablet Take by mouth.     Cholecalciferol 25 MCG (1000 UT) capsule Take by mouth.     DULoxetine (CYMBALTA) 60 MG capsule Take 1 capsule (60 mg total) by mouth daily. For leg pains 30 capsule 3   furosemide (LASIX) 80 MG tablet Take 1 tablet by mouth daily.     furosemide (LASIX) 80 MG tablet Take 1 tablet (80 mg total) by mouth Two (2) times a day. 60 tablet 5   Insulin Pen Needle (BD PEN NEEDLE NANO U/F) 32G X 4 MM MISC 1 Units by Misc.(Non-Drug; Combo Route) route daily.     loperamide (IMODIUM) 2 MG capsule Take 1 capsule (2 mg total) by mouth 2 (two) times daily as needed for diarrhea or loose stools. 14 capsule 0   Misc. Devices MISC Rolling walker with seat.  Diagnosis-frequent falls 1 each 0    spironolactone (ALDACTONE) 100 MG tablet Take 1 tablet by mouth daily.     insulin glargine (LANTUS SOLOSTAR) 100 UNIT/ML Solostar Pen Inject 37 Units into the skin at bedtime. 30 mL 6   No facility-administered medications prior to visit.     ROS Review of Systems  Constitutional:  Negative for activity change and appetite change.  HENT:  Negative for sinus pressure and sore throat.   Eyes:  Negative for visual disturbance.  Respiratory:  Negative for cough, chest tightness and shortness of breath.   Cardiovascular:  Negative for chest pain and leg swelling.  Gastrointestinal:  Positive for abdominal pain. Negative for abdominal distention, constipation and diarrhea.  Endocrine: Negative.   Genitourinary:  Negative for dysuria.  Musculoskeletal:  Negative for joint swelling and myalgias.  Skin:  Negative for rash.  Allergic/Immunologic: Negative.   Neurological:  Negative for weakness, light-headedness and numbness.  Psychiatric/Behavioral:  Negative for dysphoric mood and suicidal ideas.    Objective:  BP 131/77    Pulse 84    Resp 16    Wt 169 lb (76.7 kg)    SpO2 99%    BMI 26.47 kg/m   BP/Weight 02/06/2021 12/25/2020 [id number]  Systolic BP [id number]  Diastolic BP 77 74 73  Wt. (Lbs) 169 - -  BMI 26.47 - -  Physical Exam Constitutional:      Appearance: He is well-developed.  Cardiovascular:     Rate and Rhythm: Normal rate.     Heart sounds: Normal heart sounds. No murmur heard. Pulmonary:     Effort: Pulmonary effort is normal.     Breath sounds: Normal breath sounds. No wheezing or rales.  Chest:     Chest wall: No tenderness.  Abdominal:     General: Bowel sounds are normal. There is distension.     Palpations: Abdomen is soft. There is no mass.     Tenderness: There is no abdominal tenderness.     Hernia: A hernia (umbilical) is present.     Comments: ascites  Musculoskeletal:        General: Normal range of motion.     Right lower leg: Edema (1+)  present.     Left lower leg: Edema (1+) present.  Neurological:     Mental Status: He is alert and oriented to person, place, and time.  Psychiatric:        Mood and Affect: Mood normal.    No flowsheet data found.  Lipid Panel  No results found for: CHOL, TRIG, HDL, CHOLHDL, VLDL, LDLCALC, LDLDIRECT  CBC No results found for: WBC, RBC, HGB, HCT, PLT, MCV, MCH, MCHC, RDW, LYMPHSABS, MONOABS, EOSABS, BASOSABS  Lab Results  Component Value Date   HGBA1C 11.8 (A) 12/25/2020    Assessment & Plan:  1. Type 2 diabetes mellitus with hyperglycemia, with long-term current use of insulin (HCC) Uncontrolled with A1c of 11.6; goal is less than 7.0 CBG of 406 in the clinic, NovoLog 8 units administered and patient observed for 40 minutes after oral hydration.  Repeat CBG improved to 359 Lantus regimen has been changed from 37 units nightly to 25 units in the morning and 20 units in the evening I will see him back at his next visit to review his blood sugar log and adjust his regimen accordingly - Insulin Glargine (BASAGLAR KWIKPEN) 100 UNIT/ML; Inject subcutaneously twice daily 25 units in the morning and 20 units in the evening.  Dispense: 30 mL; Refill: 6 - insulin aspart (novoLOG) injection 8 Units - POCT glucose (manual entry) - POCT glucose (manual entry)  2. Other cirrhosis of liver (HCC) He does have ascites but is not needing paracentesis at this time Continue with Lasix, spironolactone, beta-blocker Advised to reach out to his GI if he has additional concerns to prevent confusion from different treating physicians.  3. Falls frequently Hopefully with ongoing PT he should receive some gait and strength training Fall precautions Continue with walker   Meds ordered this encounter  Medications   Insulin Glargine (BASAGLAR KWIKPEN) 100 UNIT/ML    Sig: Inject subcutaneously twice daily 25 units in the morning and 20 units in the evening.    Dispense:  30 mL    Refill:  6    insulin aspart (novoLOG) injection 8 Units    Follow-up: Return in about 2 weeks (around 02/20/2021) for Diabetes follow-up.       Hoy Register, MD, FAAFP. [facility] and Wellness [place], [place] [phone]   02/06/2021, 5:47 PM

## 2021-02-07 ENCOUNTER — Other Ambulatory Visit: Payer: Self-pay

## 2021-02-11 ENCOUNTER — Ambulatory Visit: Payer: Medicare Other

## 2021-02-12 ENCOUNTER — Other Ambulatory Visit: Payer: Self-pay | Admitting: Pharmacist

## 2021-02-12 ENCOUNTER — Other Ambulatory Visit: Payer: Self-pay

## 2021-02-12 MED ORDER — SPIRONOLACTONE 100 MG PO TABS
100.0000 mg | ORAL_TABLET | Freq: Every day | ORAL | 3 refills | Status: DC
  Filled 2021-02-12 – 2021-03-13 (×2): qty 30, 30d supply, fill #0
  Filled 2021-04-12: qty 30, 30d supply, fill #1
  Filled 2021-04-15: qty 30, 30d supply, fill #0
  Filled 2021-05-20: qty 30, 30d supply, fill #1

## 2021-02-13 ENCOUNTER — Other Ambulatory Visit: Payer: Self-pay

## 2021-02-13 ENCOUNTER — Ambulatory Visit: Payer: Medicare Other

## 2021-02-13 MED ORDER — TRUE METRIX BLOOD GLUCOSE TEST VI STRP
ORAL_STRIP | 1 refills | Status: DC
  Filled 2021-02-13 (×2): qty 100, 25d supply, fill #0
  Filled 2021-02-13 – 2021-02-21 (×2): qty 100, 50d supply, fill #0

## 2021-02-13 MED ORDER — INSULIN PEN NEEDLE 31G X 6 MM MISC
2 refills | Status: DC
  Filled 2021-02-13: qty 100, 90d supply, fill #0
  Filled 2021-04-17: qty 100, 50d supply, fill #0
  Filled 2021-06-11: qty 100, 50d supply, fill #1

## 2021-02-19 ENCOUNTER — Encounter: Payer: Medicaid Other | Admitting: Physical Therapy

## 2021-02-20 ENCOUNTER — Other Ambulatory Visit: Payer: Self-pay

## 2021-02-20 ENCOUNTER — Ambulatory Visit: Payer: Medicare Other | Admitting: Physical Therapy

## 2021-02-20 DIAGNOSIS — M6281 Muscle weakness (generalized): Secondary | ICD-10-CM

## 2021-02-20 DIAGNOSIS — R296 Repeated falls: Secondary | ICD-10-CM

## 2021-02-20 DIAGNOSIS — R260 Ataxic gait: Secondary | ICD-10-CM

## 2021-02-20 DIAGNOSIS — R262 Difficulty in walking, not elsewhere classified: Secondary | ICD-10-CM

## 2021-02-20 NOTE — Therapy (Signed)
[facility] [address]  [address] [place], [place], [address] Phone: [phone]   Fax:  [phone]  Physical Therapy Treatment  Patient Details  Name: Nicholas Gomez MRN: [id number] Date of Birth: 06/26/1945 Referring Provider (PT): Hoy Register, MD   Encounter Date: 02/20/2021   PT End of Session - 02/20/21 1527     Visit Number 4    Number of Visits 16    Date for PT Re-Evaluation 03/27/21    Authorization Type Medicaid    Progress Note Due on Visit 10    PT Start Time 1530    PT Stop Time 1610    PT Time Calculation (min) 40 min    Equipment Utilized During Treatment Gait belt    Activity Tolerance Patient tolerated treatment well;Patient limited by pain    Behavior During Therapy [facility] for tasks assessed/performed;Impulsive             Past Medical History:  Diagnosis Date   Cirrhosis (HCC)    Diabetes mellitus without complication (HCC)    Hypertension     No past surgical history on file.  There were no vitals filed for this visit.   Subjective Assessment - 02/20/21 1537     Subjective Pt. reports he has R knee pain, after falling and hitting his knees.  He reports at least 2 more falls since last seen in therapy.    Patient is accompained by: Family member;Interpreter    Pertinent History history of liver cirrhosis, EtOH abuse, esophageal varices, Diabetes, HTN, umbilical hernia    Patient Stated Goals improve balance, stop falling                [facility] PT Assessment - 02/20/21 0001       Assessment   Medical Diagnosis R29.6 (ICD-10-CM) - Falls frequently    Referring Provider (PT) Hoy Register, MD      Precautions   Precautions Fall      Transfers   Five time sit to stand comments  33 seconds without UE, 24 seconds with UE assist      6 minute walk test results    Aerobic Endurance Distance Walked 675      Timed Up and Go Test   TUG Normal TUG    Normal TUG (seconds)  40   with 4WRW                          [facility] Adult PT Treatment/Exercise - 02/20/21 0001       Ambulation/Gait   Ambulation/Gait Yes    Ambulation/Gait Assistance 4: Min guard    Ambulation/Gait Assistance Details CGA for safety/gait belt    Ambulation Distance (Feet) 675 Feet    Assistive device 4-wheeled walker    Gait Pattern Step-through pattern;Ataxic      Exercises   Exercises Knee/Hip      Knee/Hip Exercises: Aerobic   Recumbent Bike L3 x 5 min    Nustep L5 x 5 min      Knee/Hip Exercises: Seated   Sit to Sand 10 reps                       PT Short Term Goals - 02/04/21 1735       PT SHORT TERM GOAL #1   Title Pt. will complete FOTO for balance    Time 1    Period Weeks  Status Achieved    Target Date 02/06/21               PT Long Term Goals - 02/20/21 1538       PT LONG TERM GOAL #1   Title Pt. will be compliant with progressive HEP for LE strengthening/balance.    Time 8    Period Weeks    Status On-going    Target Date 03/27/21      PT LONG TERM GOAL #2   Title Pt. will demonstrate improved functional strength by completing 5x STS in <20 second safely.    Baseline 26 seconds with UE assist.    Time 8    Period Weeks    Status On-going   02/20/21- 33 sec without UE assist, 24 sec with UE assist.   Target Date 03/27/21      PT LONG TERM GOAL #3   Title Pt. will be able to complete TUG safely in <25 seconds with appropriate AD and no LOB.    Baseline 22 seconds, however modA needed to prevent falls.  Pt. with poor awarenes of balance deficits.    Time 8    Period Weeks    Status On-going   02/20/21- 40 sec with 4WRW, needed increased time for sequencing with walker.   Target Date 03/27/21      PT LONG TERM GOAL #4   Title Pt. will be complaint with use of appropriate AD and ambulate 100' safely with supervision.    Baseline inconsistent with AD use, ataxic, CGA needed for safety    Time 8    Period Weeks     Status On-going   02/20/21- progressing, but still needs CGA for safety with 2 small LOB in 100', also will let go of walker if distracted.   Target Date 03/27/21      PT LONG TERM GOAL #5   Title Pt. will demonstrate improved endurance by ambulating >900 ft in 6 minutes.    Baseline 675 ft in 6 minutes, below average for age.    Time 8    Period Weeks    Status New    Target Date 04/17/21                   Plan - 02/20/21 1532     Clinical Impression Statement [name] was accompanied by daughter and translator today.  He reports continued falls at home and R knee pain today, but pain did not prevent full participation in session.  He struggled with sequencing walker, needing constant VC to push up from chair then unlock walker, resulting in worse TUG score due to use of walker, but overall much safer with the walker than with cane.  He had LOB x 2 while completing the , and still needs constant supervision due to decreased safety awareness.  His 5xSTS score improved slightly by 2 sec with use of hands, but without hands required increased time.  Discussed progress since he has not seen a lot of improvement, but he also has cancelled multiple appointments.  He would benefit from continued skilled therapy to decrease risk of falls with injury and improve LE strength and endurance.    Personal Factors and Comorbidities Comorbidity 3+;Social Background;Age;Behavior Pattern    Comorbidities history of liver cirrhosis, EtOH abuse, esophageal varices, Diabetes, HTN, umbilical hernia    Examination-Activity Limitations Transfers;Bend;Lift;Locomotion Level;Stairs;Stand    Examination-Participation Restrictions Community Activity;Meal Prep;Shop;Cleaning;Laundry;Yard Work    Conservation officer, historic buildings Evolving/Moderate complexity  Rehab Potential Fair    PT Frequency 2x / week    PT Duration 8 weeks    PT Treatment/Interventions ADLs/Self Care Home Management;Gait  training;Stair training;Functional mobility training;Therapeutic activities;Therapeutic exercise;Balance training;Neuromuscular re-education;Patient/family education;Cryotherapy;Moist Heat;Vestibular    PT Next Visit Plan assess vestibular system/dizziness, mCTSIB, balance training/hip strengthening    Consulted and Agree with Plan of Care Patient;Family member/caregiver    Family Member Consulted daughter and daughter in law             Patient will benefit from skilled therapeutic intervention in order to improve the following deficits and impairments:  Abnormal gait, Decreased balance, Decreased mobility, Difficulty walking, Decreased knowledge of precautions, Dizziness, Pain, Decreased safety awareness, Decreased activity tolerance  Visit Diagnosis: Repeated falls  Ataxic gait  Difficulty in walking, not elsewhere classified  Muscle weakness (generalized)     Problem List Patient Active Problem List   Diagnosis Date Noted   Diabetes mellitus without complication (HCC)     Jena Gauss, PT, DPT  02/20/2021, 5:18 PM  [facility] [address]  [address] [place], [place], [address] Phone: [phone]   Fax:  [phone]  Name: Nicholas Gomez MRN: [id number] Date of Birth: Feb 24, 1946

## 2021-02-21 ENCOUNTER — Other Ambulatory Visit: Payer: Self-pay

## 2021-02-26 ENCOUNTER — Other Ambulatory Visit: Payer: Self-pay

## 2021-02-26 ENCOUNTER — Ambulatory Visit: Payer: Medicare Other | Attending: Family Medicine

## 2021-02-26 DIAGNOSIS — R262 Difficulty in walking, not elsewhere classified: Secondary | ICD-10-CM

## 2021-02-26 DIAGNOSIS — R260 Ataxic gait: Secondary | ICD-10-CM | POA: Diagnosis present

## 2021-02-26 DIAGNOSIS — M6281 Muscle weakness (generalized): Secondary | ICD-10-CM | POA: Diagnosis present

## 2021-02-26 DIAGNOSIS — R296 Repeated falls: Secondary | ICD-10-CM | POA: Diagnosis not present

## 2021-02-26 NOTE — Therapy (Addendum)
PHYSICAL THERAPY DISCHARGE SUMMARY  Visits from Start of Care: 5  Current functional level related to goals / functional outcomes: See note below.    Remaining deficits: Significant gait and balance deficits   Education / Equipment: HEP, gait belt  Plan: Patient goals were not met. Patient is being discharged due to diagnosis with COVID 19 resulting in hospitalization in January.  Since that time he has been referred to home health PT and also neurosurgery due to concerns about NPH.     Nicholas Gomez, PT, DPT 04/23/21   [facility] [address]  [place], [place], [address] Phone: [phone]   Fax:  [phone]  Physical Therapy Treatment  Patient Details  Name: Nicholas Gomez MRN: [id number] Date of Birth: [birth year] Referring Provider (PT): Nicholas Rakes, MD   Encounter Date: 02/26/2021   PT End of Session - 02/26/21 1650     Visit Number 5    Number of Visits 16    Date for PT Re-Evaluation 03/27/21    Authorization Type Medicaid    Authorization Time Period 3 visits from 1.1.23 - 1.14.23    Authorization - Visit Number 1    Authorization - Number of Visits 3    Progress Note Due on Visit 10    PT Start Time 5277    PT Stop Time 1619    PT Time Calculation (min) 41 min    Equipment Utilized During Treatment Gait belt    Activity Tolerance Patient tolerated treatment well    Behavior During Therapy [facility] for tasks assessed/performed;Impulsive             Past Medical History:  Diagnosis Date   Cirrhosis ([place])    Diabetes mellitus without complication ([place])    Hypertension     History reviewed. No pertinent surgical history.  There were no vitals filed for this visit.   Subjective Assessment - 02/26/21 1538     Subjective Pt reports that his back and knees have been bothering him since the last time he came in.    Patient is accompained by: Family member;Interpreter     Pertinent History history of liver cirrhosis, EtOH abuse, esophageal varices, Diabetes, HTN, umbilical hernia    Patient Stated Goals improve balance, stop falling    Currently in Pain? Yes    Pain Score 10-Worst pain ever    Pain Location Back    Pain Orientation Right;Left;Lower    Pain Descriptors / Indicators Aching    Pain Type Acute pain                               [facility] Adult PT Treatment/Exercise - 02/26/21 0001       Ambulation/Gait   Ambulation/Gait Yes    Ambulation/Gait Assistance 4: Min guard    Ambulation Distance (Feet) 270 Feet    Assistive device 4-wheeled walker    Gait Pattern Step-through pattern;Ataxic    Gait Comments cues during gait for controlled pace, cues for safety with STS transfers to lock brakes on walker      Exercises   Exercises Knee/Hip      Knee/Hip Exercises: Aerobic   Nustep L2x39min      Knee/Hip Exercises: Standing   Heel Raises Both;10 reps;2 seconds    Heel Raises Limitations counter support    Hip Flexion Stengthening;Both;10 reps;Knee straight    Hip Flexion Limitations counter support  Hip Abduction Stengthening;Both;10 reps;Knee straight;2 sets    Abduction Limitations counter support      Knee/Hip Exercises: Seated   Long Arc Quad Strengthening;Both;10 reps    [employer] Squeeze 15x2"    Clamshell with Nicholas Gomez   8x2"   Marching Strengthening;Both;10 reps    Marching Limitations GTB    Sit to [employer] 10 reps                       PT Short Term Goals - 02/04/21 1735       PT SHORT TERM GOAL #1   Title Pt. will complete FOTO for balance    Time 1    Period Weeks    Status Achieved    Target Date 02/06/21               PT Long Term Goals - 02/20/21 1538       PT LONG TERM GOAL #1   Title Pt. will be compliant with progressive HEP for LE strengthening/balance.    Time 8    Period Weeks    Status On-going    Target Date 03/27/21      PT LONG TERM GOAL #2   Title Pt.  will demonstrate improved functional strength by completing 5x STS in <20 second safely.    Baseline 26 seconds with UE assist.    Time 8    Period Weeks    Status On-going   02/20/21- 33 sec without UE assist, 24 sec with UE assist.   Target Date 03/27/21      PT LONG TERM GOAL #3   Title Pt. will be able to complete TUG safely in <25 seconds with appropriate AD and no LOB.    Baseline 22 seconds, however modA needed to prevent falls.  Pt. with poor awarenes of balance deficits.    Time 8    Period Weeks    Status On-going   02/20/21- 40 sec with 4WRW, needed increased time for sequencing with walker.   Target Date 03/27/21      PT LONG TERM GOAL #4   Title Pt. will be complaint with use of appropriate AD and ambulate 100' safely with supervision.    Baseline inconsistent with AD use, ataxic, CGA needed for safety    Time 8    Period Weeks    Status On-going   02/20/21- progressing, but still needs CGA for safety with 2 small LOB in 100', also will let go of walker if distracted.   Target Date 03/27/21      PT LONG TERM GOAL #5   Title Pt. will demonstrate improved endurance by ambulating >900 ft in 6 minutes.    Baseline 675 ft in 6 minutes, below average for age.    Time 8    Period Weeks    Status New    Target Date 04/17/21                   Plan - 02/26/21 1651     Clinical Impression Statement Pt continues to report falls but also notes that he does not keep his AD with him at all times when walking. He needs constant reminders to keep focus when walking and for steady pace. Progressed hip strengthening exercises, pt showed lots of compensatory movements and required constant supervision for safety. Progressed the home exercises with GTB given. He would continue to benefit from some standing/seated hip exercises to improve upright stability and also  continue reinforcement of safety measures to reduce recurrence of falls.    Personal Factors and Comorbidities  Comorbidity 3+;Social Background;Age;Behavior Pattern    Comorbidities history of liver cirrhosis, EtOH abuse, esophageal varices, Diabetes, HTN, umbilical hernia    PT Frequency 2x / week    PT Duration 8 weeks    PT Treatment/Interventions ADLs/Self Care Home Management;Gait training;Stair training;Functional mobility training;Therapeutic activities;Therapeutic exercise;Balance training;Neuromuscular re-education;Patient/family education;Cryotherapy;Moist Heat;Vestibular    PT Next Visit Plan assess vestibular system/dizziness, mCTSIB, balance training/hip strengthening    Consulted and Agree with Plan of Care Patient;Family member/caregiver    Family Member Consulted daughter             Patient will benefit from skilled therapeutic intervention in order to improve the following deficits and impairments:  Abnormal gait, Decreased balance, Decreased mobility, Difficulty walking, Decreased knowledge of precautions, Dizziness, Pain, Decreased safety awareness, Decreased activity tolerance  Visit Diagnosis: Repeated falls  Ataxic gait  Difficulty in walking, not elsewhere classified  Muscle weakness (generalized)     Problem List Patient Active Problem List   Diagnosis Date Noted   Diabetes mellitus without complication ([place])     Artist Pais, PTA 02/26/2021, 4:59 PM  [facility] [address]  [place], [place], [address] Phone: [phone]   Fax:  [phone]  Name: Nicholas Gomez MRN: [id number] Date of Birth: 1946-01-13

## 2021-03-01 ENCOUNTER — Other Ambulatory Visit: Payer: Self-pay

## 2021-03-02 ENCOUNTER — Other Ambulatory Visit: Payer: Self-pay

## 2021-03-04 ENCOUNTER — Ambulatory Visit: Payer: Medicare Other

## 2021-03-05 ENCOUNTER — Other Ambulatory Visit: Payer: Self-pay

## 2021-03-06 ENCOUNTER — Ambulatory Visit: Payer: Medicare Other

## 2021-03-11 NOTE — Progress Notes (Signed)
Established Patient Office Visit  Subjective:  Patient ID: Nicholas Gomez, male    DOB: [birth year]  Age: 76 y.o. MRN: [id number] Virtual Visit via Telephone Note  I connected with Nicholas Gomez on 03/12/21 at  9:30 AM EST by telephone and verified that I am speaking with the correct person using two identifiers.   Consent:  I discussed the limitations, risks, security and privacy concerns of performing an evaluation and management service by telephone and the availability of in person appointments. I also discussed with the patient that there may be a patient responsible charge related to this service. The patient expressed understanding and agreed to proceed.  Location of patient: Patient is at home  Location of provider: I am in my office  Persons participating in the televisit with the patient.   The patient's daughter Katharine Look is on the phone  The visit was assisted by Spanish interpreter Benjamine Mola [phone]    History of Present Illness: This was a telephone visit the patient was not capable of video technology.  Patient made the appointment because he was diagnosed with COVID with a home test on 9 January over a week ago.  The patient was not making much sense with the interpreter so the daughter was placed on the call and she was with the patient. 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  He was seen for his first initial visit in mid December by Dr. Margarita Rana.  Since the patient developed COVID on the ninth he has had persistent cough severe fatigue such as he is staying in the bed all day long.  He is only eating breakfast and not eating much.  He has severe dizziness fatigue confusion and dyspnea with minimal exertion.  All this is by phone assessment.  His blood sugars have been in the mid 150 range.  He is had frequent falls as well.  He is only had 2 vaccines in 2021 and they were  [place].    Past Medical History:  Diagnosis Date   Cirrhosis ([place])    Diabetes mellitus without complication ([place])    Hypertension     History reviewed. No pertinent surgical history.  History reviewed. No pertinent family history.  Social History   Socioeconomic History   Marital status: Married    Spouse name: Not on file   Number of children: Not on file   Years of education: Not on file   Highest education level: Not on file  Occupational History   Not on file  Tobacco Use   Smoking status: Never   Smokeless tobacco: Never  Vaping Use   Vaping Use: Never used  Substance and Sexual Activity   Alcohol use: Never   Drug use: Never   Sexual activity: Not on file  Other Topics Concern   Not on file  Social History Narrative   Not on file   Social Determinants of Health   Financial Resource Strain: Not on file  Food Insecurity: Not on file  Transportation Needs: Not on file  Physical Activity: Not on file  Stress: Not on file  Social Connections: Not on file  Intimate Partner Violence: Not on file    Outpatient Medications Prior to Visit  Medication Sig Dispense Refill   carvedilol (COREG) 3.125 MG tablet Take by mouth.     Cholecalciferol 25 MCG (1000 UT) capsule Take by  mouth.     DULoxetine (CYMBALTA) 60 MG capsule Take 1 capsule (60 mg total) by mouth daily. For leg pains 30 capsule 3   furosemide (LASIX) 80 MG tablet Take 1 tablet (80 mg total) by mouth Two (2) times a day. 60 tablet 5   glucose blood (TRUE METRIX BLOOD GLUCOSE TEST) test strip use 1 strip via meter for home glucose testing twice daily 100 strip 1   Insulin Glargine (BASAGLAR KWIKPEN) 100 UNIT/ML Inject subcutaneously twice daily 25 units in the morning and 20 units in the evening. 30 mL 6   Insulin Pen Needle 31G X 6 MM MISC USE AS DIRECTED DAILY 100 each 2   spironolactone (ALDACTONE) 100 MG tablet Take 1 tablet (100 mg total) by mouth daily. 30 tablet 3   Insulin Pen Needle  (BD PEN NEEDLE NANO U/F) 32G X 4 MM MISC 1 Units by Misc.(Non-Drug; Combo Route) route daily.     Misc. Devices MISC Rolling walker with seat.  Diagnosis-frequent falls 1 each 0   furosemide (LASIX) 80 MG tablet Take 1 tablet by mouth daily.     loperamide (IMODIUM) 2 MG capsule Take 1 capsule (2 mg total) by mouth 2 (two) times daily as needed for diarrhea or loose stools. 14 capsule 0   No facility-administered medications prior to visit.    No Known Allergies  ROS Review of Systems  Constitutional:  Positive for activity change and fatigue.  HENT: Negative.  Negative for ear pain, postnasal drip, rhinorrhea, sinus pressure, sore throat, trouble swallowing and voice change.   Eyes: Negative.   Respiratory:  Positive for cough, shortness of breath and wheezing. Negative for apnea, choking, chest tightness and stridor.   Cardiovascular: Negative.  Negative for chest pain, palpitations and leg swelling.  Gastrointestinal: Negative.  Negative for abdominal distention, abdominal pain, nausea and vomiting.  Genitourinary: Negative.   Musculoskeletal: Negative.  Negative for arthralgias and myalgias.  Skin: Negative.  Negative for rash.  Allergic/Immunologic: Negative.  Negative for environmental allergies and food allergies.  Neurological:  Positive for dizziness, weakness, light-headedness, numbness and headaches. Negative for syncope.  Hematological: Negative.  Negative for adenopathy. Does not bruise/bleed easily.  Psychiatric/Behavioral: Negative.  Negative for agitation and sleep disturbance. The patient is not nervous/anxious.      Objective:    Physical Exam No exam this is a phone visit There were no vitals taken for this visit. Wt Readings from Last 3 Encounters:  03/12/21 163 lb 14.4 oz (74.3 kg)  02/06/21 169 lb (76.7 kg)     Health Maintenance Due  Topic Date Due   COVID-19 Vaccine (1) Never done   FOOT EXAM  Never done   OPHTHALMOLOGY EXAM  Never done   URINE  MICROALBUMIN  Never done   Hepatitis C Screening  Never done   TETANUS/TDAP  Never done   COLONOSCOPY (Pts 45-38yr Insurance coverage will need to be confirmed)  Never done   Zoster Vaccines- Shingrix (1 of 2) Never done    There are no preventive care reminders to display for this patient.  No results found for: TSH No results found for: WBC, HGB, HCT, MCV, PLT No results found for: NA, K, CHLORIDE, CO2, GLUCOSE, BUN, CREATININE, BILITOT, ALKPHOS, AST, ALT, PROT, ALBUMIN, CALCIUM, ANIONGAP, EGFR, GFR No results found for: CHOL No results found for: HDL No results found for: LDLCALC No results found for: TRIG No results found for: CHOLHDL Lab Results  Component Value Date   HGBA1C 11.8 (A)  12/25/2020      Assessment & Plan:   Problem List Items Addressed This Visit       Other   COVID-19 virus infection    This patient is positive for COVID from a home test and is 76 years of age has underlying liver disease severe diabetes and is experiencing complications from [place] by telephone interview  He is out of the window for antibody therapy or oral antivirals  I recommend he be taken to the emergency room the daughter states she cannot get him into a car I said call 911 and have EMS come to assess him and take him to the hospital immediately  The daughter stated she would follow through on this recommendation       No orders of the defined types were placed in this encounter.   Follow Up Instructions:  Patient's daughter instructed to call EMS and take him immediately to the hospital because of his severity of his COVID illness as perceived over the phone I discussed the assessment and treatment plan with the patient. The patient was provided an opportunity to ask questions and all were answered. The patient agreed with the plan and demonstrated an understanding of the instructions.   The patient was advised to call back or seek an in-person evaluation if the symptoms  worsen or if the condition fails to improve as anticipated.  I provided 28 minutes of non-face-to-face time during this encounter  including  median intraservice time , review of notes, labs, imaging, medications  and explaining diagnosis and management to the patient .    Asencion Noble, MD

## 2021-03-12 ENCOUNTER — Telehealth (HOSPITAL_BASED_OUTPATIENT_CLINIC_OR_DEPARTMENT_OTHER): Payer: Medicare Other | Admitting: Critical Care Medicine

## 2021-03-12 ENCOUNTER — Emergency Department (HOSPITAL_BASED_OUTPATIENT_CLINIC_OR_DEPARTMENT_OTHER)
Admission: EM | Admit: 2021-03-12 | Discharge: 2021-03-12 | Disposition: A | Discharge status: Home / Self Care | Payer: Medicare Other | Attending: Emergency Medicine | Admitting: Emergency Medicine

## 2021-03-12 ENCOUNTER — Emergency Department (HOSPITAL_BASED_OUTPATIENT_CLINIC_OR_DEPARTMENT_OTHER): Payer: Medicare Other

## 2021-03-12 ENCOUNTER — Encounter (HOSPITAL_BASED_OUTPATIENT_CLINIC_OR_DEPARTMENT_OTHER): Payer: Self-pay

## 2021-03-12 ENCOUNTER — Other Ambulatory Visit: Payer: Self-pay

## 2021-03-12 ENCOUNTER — Encounter: Payer: Self-pay | Admitting: Critical Care Medicine

## 2021-03-12 DIAGNOSIS — Z79899 Other long term (current) drug therapy: Secondary | ICD-10-CM | POA: Diagnosis not present

## 2021-03-12 DIAGNOSIS — U071 COVID-19: Secondary | ICD-10-CM | POA: Diagnosis not present

## 2021-03-12 DIAGNOSIS — Z794 Long term (current) use of insulin: Secondary | ICD-10-CM | POA: Diagnosis not present

## 2021-03-12 DIAGNOSIS — I10 Essential (primary) hypertension: Secondary | ICD-10-CM | POA: Insufficient documentation

## 2021-03-12 DIAGNOSIS — R059 Cough, unspecified: Secondary | ICD-10-CM | POA: Diagnosis present

## 2021-03-12 DIAGNOSIS — E119 Type 2 diabetes mellitus without complications: Secondary | ICD-10-CM | POA: Insufficient documentation

## 2021-03-12 LAB — CBG MONITORING, ED: Glucose-Capillary: 218 mg/dL — ABNORMAL HIGH (ref 70–99)

## 2021-03-12 MED ORDER — MECLIZINE HCL 25 MG PO TABS
12.5000 mg | ORAL_TABLET | Freq: Once | ORAL | Status: AC
Start: 2021-03-12 — End: 2021-03-12
  Administered 2021-03-12: 12.5 mg via ORAL
  Filled 2021-03-12: qty 1

## 2021-03-12 MED ORDER — MECLIZINE HCL 12.5 MG PO TABS: 12.5000 mg | ORAL_TABLET | Freq: Two times a day (BID) | ORAL | 0 refills | Status: DC | PRN

## 2021-03-12 NOTE — ED Triage Notes (Addendum)
Pt BIB EMS from home. Pt diagnosed with COVID a week ago. C/o dizziness, shortness of breath. Hx of vertigo. Had tele visit with doctor who was concerned for pneumonia.   Video interpreter Charlcie Cradle [phone] used during triage.

## 2021-03-12 NOTE — Assessment & Plan Note (Signed)
This patient is positive for COVID from a home test and is 76 years of age has underlying liver disease severe diabetes and is experiencing complications from COVID by telephone interview  He is out of the window for antibody therapy or oral antivirals  I recommend he be taken to the emergency room the daughter states she cannot get him into a car I said call 911 and have EMS come to assess him and take him to the hospital immediately  The daughter stated she would follow through on this recommendation

## 2021-03-12 NOTE — ED Provider Notes (Signed)
[facility] EMERGENCY DEPARTMENT Provider Note   CSN: [id number] Arrival date & time: 03/12/21  1057     History  Chief Complaint  Patient presents with   Covid Positive    Nicholas Gomez is a 76 y.o. male.  The history is provided by the patient and a caregiver. A language interpreter was used.  Cough Cough characteristics:  Non-productive Sputum characteristics:  Nondescript Severity:  Moderate Onset quality:  Gradual Duration:  1 week Timing:  Intermittent Progression:  Waxing and waning Chronicity:  New Context: upper respiratory infection (covid positive over 5 days)   Relieved by:  Nothing Worsened by:  Nothing Associated symptoms: no chest pain, no chills, no diaphoresis, no ear fullness, no ear pain, no eye discharge, no fever, no headaches, no myalgias, no rhinorrhea, no shortness of breath, no sinus congestion, no sore throat and no wheezing       Home Medications Prior to Admission medications   Medication Sig Start Date End Date Taking? Authorizing Provider  carvedilol (COREG) 3.125 MG tablet Take by mouth. 07/02/20 07/02/21  [provider]  Cholecalciferol 25 MCG (1000 UT) capsule Take by mouth.    [provider]  DULoxetine (CYMBALTA) 60 MG capsule Take 1 capsule (60 mg total) by mouth daily. For leg pains 12/25/20   Charlott Rakes, MD  furosemide (LASIX) 80 MG tablet Take 1 tablet (80 mg total) by mouth Two (2) times a day. 01/22/21     glucose blood (TRUE METRIX BLOOD GLUCOSE TEST) test strip use 1 strip via meter for home glucose testing twice daily 07/15/20     Insulin Glargine (BASAGLAR KWIKPEN) 100 UNIT/ML Inject subcutaneously twice daily 25 units in the morning and 20 units in the evening. 02/06/21   Charlott Rakes, MD  Insulin Pen Needle 31G X 6 MM MISC USE AS DIRECTED DAILY 10/09/20     Misc. Devices MISC Rolling walker with seat.  Diagnosis-frequent falls 01/31/21   Charlott Rakes, MD  spironolactone (ALDACTONE) 100 MG  tablet Take 1 tablet (100 mg total) by mouth daily. 02/12/21 05/03/22  Charlott Rakes, MD      Allergies    Patient has no known allergies.    Review of Systems   Review of Systems  Constitutional:  Negative for chills, diaphoresis and fever.  HENT:  Negative for ear pain, rhinorrhea and sore throat.   Eyes:  Negative for discharge.  Respiratory:  Positive for cough. Negative for shortness of breath and wheezing.   Cardiovascular:  Negative for chest pain.  Musculoskeletal:  Negative for myalgias.  Neurological:  Negative for headaches.   Physical Exam Updated Vital Signs BP 103/71    Pulse 75    Temp 98.5 F (36.9 C) (Oral)    Resp 16    Ht 5\' 7"  (1.702 m)    Wt 74.3 kg    SpO2 99%    BMI 25.67 kg/m  Physical Exam Vitals and nursing note reviewed.  Constitutional:      General: He is not in acute distress.    Appearance: He is well-developed. He is not ill-appearing.  HENT:     Head: Normocephalic and atraumatic.     Mouth/Throat:     Mouth: Mucous membranes are moist.  Eyes:     Extraocular Movements: Extraocular movements intact.     Conjunctiva/sclera: Conjunctivae normal.     Pupils: Pupils are equal, round, and reactive to light.  Cardiovascular:     Rate and Rhythm: Normal rate and regular  rhythm.     Pulses: Normal pulses.     Heart sounds: Normal heart sounds. No murmur heard. Pulmonary:     Effort: Pulmonary effort is normal. No respiratory distress.     Breath sounds: Normal breath sounds.  Abdominal:     Palpations: Abdomen is soft.     Tenderness: There is no abdominal tenderness.  Musculoskeletal:        General: No swelling.     Cervical back: Normal range of motion and neck supple.     Right lower leg: No edema.     Left lower leg: No edema.  Skin:    General: Skin is warm and dry.     Capillary Refill: Capillary refill takes less than 2 seconds.  Neurological:     General: No focal deficit present.     Mental Status: He is alert and oriented to  person, place, and time.     Cranial Nerves: No cranial nerve deficit.     Sensory: No sensory deficit.     Motor: No weakness.     Coordination: Coordination normal.  Psychiatric:        Mood and Affect: Mood normal.    ED Results / Procedures / Treatments   Labs (all labs ordered are listed, but only abnormal results are displayed) Labs Reviewed  CBG MONITORING, ED - Abnormal; Notable for the following components:      Result Value   Glucose-Capillary 218 (*)    All other components within normal limits    EKG EKG Interpretation  Date/Time:  Tuesday March 12 2021 11:05:43 EST Ventricular Rate:  73 PR Interval:  134 QRS Duration: 95 QT Interval:  410 QTC Calculation: 452 R Axis:   32 Text Interpretation: Sinus rhythm Confirmed by Lennice Sites ([id number]) on 03/12/2021 11:11:37 AM  Radiology DG Chest Portable 1 View  Result Date: 03/12/2021 CLINICAL DATA:  Dizziness, shortness of breath, COVID positive EXAM: PORTABLE CHEST 1 VIEW COMPARISON:  None. FINDINGS: The heart is at the upper limits of normal for size. The upper mediastinal contours are within normal limits. There is significant asymmetric elevation of the left hemidiaphragm with adjacent linear opacities likely reflecting atelectasis. There is no other focal consolidation. There is no pulmonary edema. There is no significant pleural effusion. There is no pneumothorax. There is no acute osseous abnormality. IMPRESSION: 1. Asymmetric elevation of the left hemidiaphragm with adjacent linear opacities likely reflecting atelectasis. 2. Borderline cardiomegaly. Electronically Signed   By: Valetta Mole M.D.   On: 03/12/2021 11:33    Procedures Procedures    Medications Ordered in ED Medications - No data to display  ED Course/ Medical Decision Making/ A&P                           Medical Decision Making Amount and/or Complexity of Data Reviewed Radiology: ordered.   [facility] [name] is here with cough and cold.   Diagnosed with COVID about a week ago.  Not on antiviral.  History of cirrhosis, diabetes, hypertension.  Normal vitals.  No fever.  Has ongoing cough.  Overall appears well.  Clear breath sounds.  No abdominal tenderness.  Normal neurologic exam.  Has had generalized weakness and malaise his last several days.  Everybody at home positive for COVID as well.  Has been eating and drinking okay.  Was sent here by primary care doctor for evaluation for pneumonia.  Overall clinically appears well.  He has no  respiratory distress.  Normal oxygen.  Will obtain chest x-ray.  Chest x-ray reviewed and interpreted by myself shows no acute findings.  No pneumonia or focal consolidation.  Overall patient with ongoing COVID but no concerning features.  Not a candidate for antiviral at this time given the length of symptoms.  Continue supportive management at home.  Understands return precautions.  This chart was dictated using voice recognition software.  Despite best efforts to proofread,  errors can occur which can change the documentation meaning.         Final Clinical Impression(s) / ED Diagnoses Final diagnoses:  [id number]    Rx / DC Orders ED Discharge Orders     None         Lennice Sites, DO 03/12/21 1221

## 2021-03-13 ENCOUNTER — Telehealth: Payer: Self-pay | Admitting: Clinical

## 2021-03-13 ENCOUNTER — Other Ambulatory Visit: Payer: Self-pay

## 2021-03-13 NOTE — Telephone Encounter (Signed)
I spoke with pt's daughter and she mentioned that her father is having problems with his memory and is also experiencing auditory hallucinations. I provided her with information on [facility] and I encouraged her to schedule a psychiatry appt for him. She mentioned that she is caring for him. She also mentioned he has an upcoming neurology appt.

## 2021-03-14 ENCOUNTER — Other Ambulatory Visit: Payer: Self-pay

## 2021-03-14 MED ORDER — TRUE METRIX BLOOD GLUCOSE TEST VI STRP
ORAL_STRIP | 6 refills | Status: DC
  Filled 2021-03-14 – 2021-05-06 (×2): qty 100, 50d supply, fill #0
  Filled 2021-08-01: qty 100, 50d supply, fill #1

## 2021-03-14 NOTE — Telephone Encounter (Signed)
Refilled test strips.

## 2021-03-25 ENCOUNTER — Other Ambulatory Visit: Payer: Self-pay

## 2021-03-25 MED ORDER — LACTULOSE ENCEPHALOPATHY 10 GM/15ML PO SOLN
ORAL | 3 refills | Status: DC
  Filled 2021-03-25: qty 240, 8d supply, fill #0

## 2021-04-01 ENCOUNTER — Other Ambulatory Visit: Payer: Self-pay

## 2021-04-01 MED ORDER — CARVEDILOL 6.25 MG PO TABS
6.2500 mg | ORAL_TABLET | Freq: Two times a day (BID) | ORAL | 1 refills | Status: DC
  Filled 2021-04-01: qty 180, 90d supply, fill #0
  Filled 2021-07-08: qty 180, 90d supply, fill #1
  Filled 2021-07-09: qty 180, 90d supply, fill #0

## 2021-04-03 ENCOUNTER — Other Ambulatory Visit: Payer: Self-pay

## 2021-04-05 ENCOUNTER — Other Ambulatory Visit: Payer: Self-pay

## 2021-04-05 MED ORDER — FARXIGA 5 MG PO TABS
5.0000 mg | ORAL_TABLET | Freq: Every day | ORAL | 0 refills | Status: DC
  Filled 2021-04-05: qty 90, 90d supply, fill #0

## 2021-04-08 ENCOUNTER — Other Ambulatory Visit: Payer: Self-pay

## 2021-04-08 MED ORDER — LACTULOSE 10 GM/15ML PO SOLN
ORAL | 5 refills | Status: DC
  Filled 2021-04-08: qty 946, 32d supply, fill #0
  Filled 2021-05-13: qty 946, 32d supply, fill #1
  Filled 2021-06-18 (×2): qty 946, 32d supply, fill #2

## 2021-04-09 ENCOUNTER — Other Ambulatory Visit: Payer: Self-pay

## 2021-04-11 ENCOUNTER — Other Ambulatory Visit (HOSPITAL_COMMUNITY): Payer: Self-pay | Admitting: Gastroenterology

## 2021-04-11 ENCOUNTER — Other Ambulatory Visit: Payer: Self-pay | Admitting: Gastroenterology

## 2021-04-11 DIAGNOSIS — R188 Other ascites: Secondary | ICD-10-CM

## 2021-04-12 ENCOUNTER — Other Ambulatory Visit: Payer: Self-pay

## 2021-04-15 ENCOUNTER — Other Ambulatory Visit (HOSPITAL_COMMUNITY): Payer: Self-pay

## 2021-04-15 ENCOUNTER — Other Ambulatory Visit: Payer: Self-pay

## 2021-04-17 ENCOUNTER — Other Ambulatory Visit (HOSPITAL_COMMUNITY): Payer: Self-pay

## 2021-04-19 ENCOUNTER — Other Ambulatory Visit: Payer: Self-pay

## 2021-04-22 ENCOUNTER — Other Ambulatory Visit: Payer: Self-pay

## 2021-04-22 ENCOUNTER — Encounter (HOSPITAL_COMMUNITY): Payer: Self-pay

## 2021-04-22 ENCOUNTER — Ambulatory Visit (HOSPITAL_COMMUNITY)
Admission: RE | Admit: 2021-04-22 | Discharge: 2021-04-22 | Disposition: A | Discharge status: Home / Self Care | Payer: Medicare Other | Source: Ambulatory Visit | Attending: Gastroenterology | Admitting: Gastroenterology

## 2021-04-22 DIAGNOSIS — R188 Other ascites: Secondary | ICD-10-CM | POA: Diagnosis present

## 2021-04-22 HISTORY — PX: IR PARACENTESIS: IMG2679

## 2021-04-22 LAB — BODY FLUID CELL COUNT WITH DIFFERENTIAL
Eos, Fluid: 0 %
Lymphs, Fluid: 41 %
Monocyte-Macrophage-Serous Fluid: 56 % (ref 50–90)
Neutrophil Count, Fluid: 3 % (ref 0–25)
Total Nucleated Cell Count, Fluid: 323 cu mm (ref 0–1000)

## 2021-04-22 LAB — PROTEIN, PLEURAL OR PERITONEAL FLUID: Total protein, fluid: <3 g/dL

## 2021-04-22 MED ORDER — ALBUMIN HUMAN 25 % IV SOLN
50.0000 g | Freq: Once | INTRAVENOUS | Status: DC
  Filled 2021-04-22: qty 200

## 2021-04-22 MED ORDER — LIDOCAINE HCL 1 % IJ SOLN
INTRAMUSCULAR | Status: AC
  Filled 2021-04-22: qty 20

## 2021-04-22 NOTE — Procedures (Signed)
PROCEDURE SUMMARY:  Successful image-guided paracentesis from the right lower abdomen.  Yielded 4.0 liters of clear yellow fluid.  No immediate complications.  EBL < 1 mL Patient tolerated well.   Specimen was sent for labs.  Please see imaging section of Epic for full dictation.  Joaquim Nam PA-C 04/22/2021 12:58 PM

## 2021-04-23 LAB — PATHOLOGIST SMEAR REVIEW

## 2021-05-06 ENCOUNTER — Other Ambulatory Visit (HOSPITAL_COMMUNITY): Payer: Self-pay | Admitting: Physician Assistant

## 2021-05-06 ENCOUNTER — Other Ambulatory Visit: Payer: Self-pay

## 2021-05-06 ENCOUNTER — Encounter (HOSPITAL_COMMUNITY): Payer: Self-pay | Admitting: Radiology

## 2021-05-06 ENCOUNTER — Inpatient Hospital Stay (HOSPITAL_COMMUNITY): Admission: RE | Admit: 2021-05-06 | Payer: Medicare Other | Source: Ambulatory Visit

## 2021-05-07 ENCOUNTER — Other Ambulatory Visit: Payer: Self-pay

## 2021-05-08 ENCOUNTER — Other Ambulatory Visit: Payer: Self-pay

## 2021-05-08 MED ORDER — FARXIGA 5 MG PO TABS
5.0000 mg | ORAL_TABLET | Freq: Every day | ORAL | 0 refills | Status: DC
  Filled 2021-05-08: qty 30, 30d supply, fill #0
  Filled 2021-06-11: qty 90, 90d supply, fill #0

## 2021-05-13 ENCOUNTER — Other Ambulatory Visit: Payer: Self-pay

## 2021-05-14 ENCOUNTER — Other Ambulatory Visit: Payer: Self-pay

## 2021-05-16 ENCOUNTER — Other Ambulatory Visit: Payer: Self-pay

## 2021-05-20 ENCOUNTER — Other Ambulatory Visit (HOSPITAL_COMMUNITY): Payer: Medicare Other

## 2021-05-20 ENCOUNTER — Other Ambulatory Visit (HOSPITAL_COMMUNITY): Payer: Self-pay

## 2021-05-21 ENCOUNTER — Encounter (HOSPITAL_COMMUNITY): Payer: Self-pay

## 2021-05-21 ENCOUNTER — Ambulatory Visit (HOSPITAL_COMMUNITY)
Admission: RE | Admit: 2021-05-21 | Discharge: 2021-05-21 | Disposition: A | Discharge status: Home / Self Care | Payer: 59 | Source: Ambulatory Visit

## 2021-05-21 ENCOUNTER — Ambulatory Visit (HOSPITAL_COMMUNITY)
Admission: RE | Admit: 2021-05-21 | Discharge: 2021-05-21 | Disposition: A | Discharge status: Home / Self Care | Payer: 59 | Source: Ambulatory Visit | Attending: Gastroenterology | Admitting: Gastroenterology

## 2021-05-21 ENCOUNTER — Other Ambulatory Visit: Payer: Self-pay

## 2021-05-21 ENCOUNTER — Other Ambulatory Visit (HOSPITAL_COMMUNITY): Payer: Self-pay

## 2021-05-21 DIAGNOSIS — R188 Other ascites: Secondary | ICD-10-CM | POA: Insufficient documentation

## 2021-05-21 HISTORY — PX: IR PARACENTESIS: IMG2679

## 2021-05-21 LAB — BODY FLUID CELL COUNT WITH DIFFERENTIAL
Eos, Fluid: 0 %
Lymphs, Fluid: 61 %
Monocyte-Macrophage-Serous Fluid: 39 % — ABNORMAL LOW (ref 50–90)
Neutrophil Count, Fluid: 0 % (ref 0–25)
Total Nucleated Cell Count, Fluid: 273 cu mm (ref 0–1000)

## 2021-05-21 LAB — ALBUMIN, PLEURAL OR PERITONEAL FLUID: Albumin, Fluid: <1.5 g/dL

## 2021-05-21 LAB — GRAM STAIN

## 2021-05-21 LAB — PROTEIN, PLEURAL OR PERITONEAL FLUID: Total protein, fluid: <3 g/dL

## 2021-05-21 MED ORDER — LIDOCAINE HCL (PF) 1 % IJ SOLN
INTRAMUSCULAR | Status: DC | PRN
  Administered 2021-05-21: 10 mL

## 2021-05-21 MED ORDER — LIDOCAINE HCL 1 % IJ SOLN
INTRAMUSCULAR | Status: AC
  Filled 2021-05-21: qty 20

## 2021-05-21 NOTE — Procedures (Signed)
PROCEDURE SUMMARY: ? ?Successful ultrasound guided paracentesis from the right lower  quadrant.  ?Yielded 3.2 L of clear yellow fluid.  ?No immediate complications.  ?The patient tolerated the procedure well.  ? ?Specimen sent for labs. ? ?EBL < 2 mL ? ?If the patient eventually requires >/=2 paracenteses in a 30 day period, candidacy for formal evaluation by the [facility] Radiology Portal Hypertension [facility] will be assessed. ? Alwyn Ren, AGACNP-BC ?[phone] ?05/21/2021, 2:31 PM ? ? ?

## 2021-05-22 ENCOUNTER — Other Ambulatory Visit (HOSPITAL_COMMUNITY): Payer: Self-pay | Admitting: Gastroenterology

## 2021-05-22 DIAGNOSIS — R188 Other ascites: Secondary | ICD-10-CM

## 2021-05-23 LAB — PATHOLOGIST SMEAR REVIEW

## 2021-05-26 LAB — CULTURE, BODY FLUID W GRAM STAIN -BOTTLE: Culture: NO GROWTH

## 2021-06-11 ENCOUNTER — Other Ambulatory Visit (HOSPITAL_COMMUNITY): Payer: Self-pay

## 2021-06-18 ENCOUNTER — Other Ambulatory Visit: Payer: Self-pay

## 2021-06-19 ENCOUNTER — Other Ambulatory Visit: Payer: Self-pay

## 2021-06-20 ENCOUNTER — Other Ambulatory Visit: Payer: Self-pay | Admitting: Family Medicine

## 2021-06-20 ENCOUNTER — Other Ambulatory Visit (HOSPITAL_COMMUNITY): Payer: Self-pay

## 2021-06-20 MED ORDER — SPIRONOLACTONE 100 MG PO TABS
100.0000 mg | ORAL_TABLET | Freq: Every day | ORAL | 0 refills | Status: DC
  Filled 2021-06-20: qty 30, 30d supply, fill #0

## 2021-06-21 ENCOUNTER — Ambulatory Visit (HOSPITAL_COMMUNITY)
Admission: RE | Admit: 2021-06-21 | Discharge: 2021-06-21 | Disposition: A | Discharge status: Home / Self Care | Payer: 59 | Source: Ambulatory Visit | Attending: Gastroenterology | Admitting: Gastroenterology

## 2021-06-21 DIAGNOSIS — R188 Other ascites: Secondary | ICD-10-CM

## 2021-06-21 HISTORY — PX: IR PARACENTESIS: IMG2679

## 2021-06-21 LAB — BODY FLUID CELL COUNT WITH DIFFERENTIAL
Eos, Fluid: 0 %
Lymphs, Fluid: 41 %
Monocyte-Macrophage-Serous Fluid: 53 % (ref 50–90)
Neutrophil Count, Fluid: 6 % (ref 0–25)
Total Nucleated Cell Count, Fluid: 217 cu mm (ref 0–1000)

## 2021-06-21 LAB — PROTEIN, PLEURAL OR PERITONEAL FLUID: Total protein, fluid: <3 g/dL

## 2021-06-21 LAB — GRAM STAIN

## 2021-06-21 LAB — ALBUMIN, PLEURAL OR PERITONEAL FLUID: Albumin, Fluid: <1.5 g/dL

## 2021-06-21 MED ORDER — LIDOCAINE HCL (PF) 1 % IJ SOLN
INTRAMUSCULAR | Status: DC | PRN
  Administered 2021-06-21: 10 mL

## 2021-06-21 NOTE — Procedures (Signed)
PROCEDURE SUMMARY: ? ?Successful ultrasound guided paracentesis from the right lower  quadrant.  ?Yielded 4 L of straw fluid.  ?No immediate complications.  ?The patient tolerated the procedure well.  ? ?EBL < 67mL ? ?The patient has required >/=2 paracenteses in a 30 day period and a screening evaluation by the [facility] Interventional Radiology Portal Hypertension Clinic has been arranged. ? ? ?Marliss Coots, MD ?Pager: [phone] ? ? ?

## 2021-06-23 ENCOUNTER — Emergency Department (HOSPITAL_BASED_OUTPATIENT_CLINIC_OR_DEPARTMENT_OTHER)
Admission: EM | Admit: 2021-06-23 | Discharge: 2021-06-23 | Disposition: A | Discharge status: Nursing Home (Includes ICF) | Payer: 59 | Attending: Emergency Medicine | Admitting: Emergency Medicine

## 2021-06-23 ENCOUNTER — Emergency Department (HOSPITAL_BASED_OUTPATIENT_CLINIC_OR_DEPARTMENT_OTHER): Payer: 59

## 2021-06-23 ENCOUNTER — Encounter (HOSPITAL_BASED_OUTPATIENT_CLINIC_OR_DEPARTMENT_OTHER): Payer: Self-pay | Admitting: Emergency Medicine

## 2021-06-23 ENCOUNTER — Other Ambulatory Visit: Payer: Self-pay

## 2021-06-23 DIAGNOSIS — K56699 Other intestinal obstruction unspecified as to partial versus complete obstruction: Secondary | ICD-10-CM | POA: Diagnosis not present

## 2021-06-23 DIAGNOSIS — K56609 Unspecified intestinal obstruction, unspecified as to partial versus complete obstruction: Secondary | ICD-10-CM

## 2021-06-23 DIAGNOSIS — Z794 Long term (current) use of insulin: Secondary | ICD-10-CM | POA: Insufficient documentation

## 2021-06-23 DIAGNOSIS — R1084 Generalized abdominal pain: Secondary | ICD-10-CM | POA: Diagnosis present

## 2021-06-23 LAB — URINALYSIS, ROUTINE W REFLEX MICROSCOPIC
Bilirubin Urine: NEGATIVE
Glucose, UA: >=500 mg/dL — AB
Ketones, ur: NEGATIVE mg/dL
Leukocytes,Ua: NEGATIVE
Nitrite: NEGATIVE
Protein, ur: NEGATIVE mg/dL
Specific Gravity, Urine: 1.015 (ref 1.005–1.030)
pH: 5 (ref 5.0–8.0)

## 2021-06-23 LAB — CBC WITH DIFFERENTIAL/PLATELET
Abs Immature Granulocytes: 0.04 10*3/uL (ref 0.00–0.07)
Basophils Absolute: 0 10*3/uL (ref 0.0–0.1)
Basophils Relative: 0 %
Eosinophils Absolute: 0.1 10*3/uL (ref 0.0–0.5)
Eosinophils Relative: 1 %
HCT: 38.7 % — ABNORMAL LOW (ref 39.0–52.0)
Hemoglobin: 12.3 g/dL — ABNORMAL LOW (ref 13.0–17.0)
Immature Granulocytes: 0 %
Lymphocytes Relative: 7 %
Lymphs Abs: 0.7 10*3/uL (ref 0.7–4.0)
MCH: 24.9 pg — ABNORMAL LOW (ref 26.0–34.0)
MCHC: 31.8 g/dL (ref 30.0–36.0)
MCV: 78.5 fL — ABNORMAL LOW (ref 80.0–100.0)
Monocytes Absolute: 0.9 10*3/uL (ref 0.1–1.0)
Monocytes Relative: 10 %
Neutro Abs: 7.7 10*3/uL (ref 1.7–7.7)
Neutrophils Relative %: 82 %
Platelets: 270 10*3/uL (ref 150–400)
RBC: 4.93 MIL/uL (ref 4.22–5.81)
RDW: 18.2 % — ABNORMAL HIGH (ref 11.5–15.5)
WBC: 9.5 10*3/uL (ref 4.0–10.5)
nRBC: 0 % (ref 0.0–0.2)

## 2021-06-23 LAB — URINALYSIS, MICROSCOPIC (REFLEX)

## 2021-06-23 LAB — TROPONIN I (HIGH SENSITIVITY): Troponin I (High Sensitivity): 12 ng/L (ref <18)

## 2021-06-23 LAB — COMPREHENSIVE METABOLIC PANEL
ALT: 33 U/L (ref 0–44)
AST: 41 U/L (ref 15–41)
Albumin: 2.9 g/dL — ABNORMAL LOW (ref 3.5–5.0)
Alkaline Phosphatase: 239 U/L — ABNORMAL HIGH (ref 38–126)
Anion gap: 12 (ref 5–15)
BUN: 62 mg/dL — ABNORMAL HIGH (ref 8–23)
CO2: 26 mmol/L (ref 22–32)
Calcium: 9.4 mg/dL (ref 8.9–10.3)
Chloride: 94 mmol/L — ABNORMAL LOW (ref 98–111)
Creatinine, Ser: 1.99 mg/dL — ABNORMAL HIGH (ref 0.61–1.24)
GFR, Estimated: 34 mL/min — ABNORMAL LOW (ref >60)
Glucose, Bld: 358 mg/dL — ABNORMAL HIGH (ref 70–99)
Potassium: 5.1 mmol/L (ref 3.5–5.1)
Sodium: 132 mmol/L — ABNORMAL LOW (ref 135–145)
Total Bilirubin: 1.2 mg/dL (ref 0.3–1.2)
Total Protein: 7.3 g/dL (ref 6.5–8.1)

## 2021-06-23 LAB — PROTIME-INR
INR: 1.2 (ref 0.8–1.2)
Prothrombin Time: 15.3 seconds — ABNORMAL HIGH (ref 11.4–15.2)

## 2021-06-23 LAB — LIPASE, BLOOD: Lipase: 24 U/L (ref 11–51)

## 2021-06-23 LAB — AMMONIA: Ammonia: 26 umol/L (ref 9–35)

## 2021-06-23 LAB — LACTIC ACID, PLASMA: Lactic Acid, Venous: 1.8 mmol/L (ref 0.5–1.9)

## 2021-06-23 MED ORDER — SODIUM CHLORIDE 0.9 % IV BOLUS
500.0000 mL | Freq: Once | INTRAVENOUS | Status: AC
  Administered 2021-06-23: 500 mL via INTRAVENOUS

## 2021-06-23 NOTE — ED Notes (Signed)
Attempted to call report nurse not available at this time.  

## 2021-06-23 NOTE — ED Triage Notes (Signed)
Patient came in via EMS from home. Per EMS, family reports nausea and vomiting since Friday. Patient was seen at [facility] on Friday and had fluid drained off his abdomen. Family reports patient has not been eating for the last few days. VS per EMS temp 97.3, HR 80, BP 124/74, RR 24. Patient does have history of liver cirrhosis.  ?

## 2021-06-23 NOTE — ED Provider Notes (Signed)
?[facility] DEPARTMENT ?Provider Note ? ? ?CSN: [id number] ?Arrival date & time: 06/23/21  1000 ? ?  ? ?History ? ?Chief Complaint  ?Patient presents with  ? Weakness  ? ? ?Nicholas Gomez is a 76 y.o. male. ? ?Patient with history of cirrhosis requiring frequent paracenteses, presents chief complaint abdominal pain.  Ongoing for 3 days describes diffuse abdomen pain and swelling.  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  He said vomiting at home as well for the past 3 days.  Denies fevers denies diarrhea.  He had paracentesis performed about 3 to 4 days ago at an outpatient facility as well. ? ? ?  ? ?Home Medications ?Prior to Admission medications   ?Medication Sig Start Date End Date Taking? Authorizing Provider  ?carvedilol (COREG) 3.125 MG tablet Take by mouth. 07/02/20 07/02/21  [provider]  ?carvedilol (COREG) 6.25 MG tablet Take 1 tablet (6.25 mg total) by mouth 2 (two) times daily. 09/17/20     ?Cholecalciferol 25 MCG (1000 UT) capsule Take by mouth.    [provider]  ?dapagliflozin propanediol (FARXIGA) 5 MG TABS tablet Take 1 tablet (5 mg total) by mouth daily. 05/08/21     ?DULoxetine (CYMBALTA) 60 MG capsule Take 1 capsule (60 mg total) by mouth daily. For leg pains 12/25/20   Charlott Rakes, MD  ?furosemide (LASIX) 80 MG tablet Take 1 tablet (80 mg total) by mouth two (2) times a day. 01/22/21     ?glucose blood (TRUE METRIX BLOOD GLUCOSE TEST) test strip use as directed twice daily 03/14/21   Charlott Rakes, MD  ?Insulin Glargine (BASAGLAR KWIKPEN) 100 UNIT/ML Inject subcutaneously twice daily 25 units in the morning and 20 units in the evening. 02/06/21   Charlott Rakes, MD  ?Insulin Pen Needle 31G X 6 MM MISC USE AS DIRECTED ONCE TO TWICE DAILY 10/09/20     ?lactulose (CHRONULAC) 10 GM/15ML solution Take 15 mL (10 g total) by mouth Two (2) times a day. 04/05/21     ?lactulose,  encephalopathy, (CHRONULAC) 10 GM/15ML SOLN Take 15 mL (10 g total) by mouth Two (2) times a day. 03/25/21     ?meclizine (ANTIVERT) 12.5 MG tablet Take 1 tablet (12.5 mg total) by mouth 2 (two) times daily as needed for dizziness. 03/12/21   Lennice Sites, DO  ?Misc. Devices MISC Rolling walker with seat.  Diagnosis-frequent falls 01/31/21   Charlott Rakes, MD  ?spironolactone (ALDACTONE) 100 MG tablet Take 1 tablet (100 mg total) by mouth daily.  **MUST HAVE OFFICE VISIT FOR FURTHER REFILL(S).** 06/20/21   Charlott Rakes, MD  ?   ? ?Allergies    ?Patient has no known allergies.   ? ?Review of Systems   ?Review of Systems  ?Constitutional:  Negative for fever.  ?HENT:  Negative for ear pain and sore throat.   ?Eyes:  Negative for pain.  ?Respiratory:  Negative for cough.   ?Cardiovascular:  Negative for chest pain.  ?Gastrointestinal:  Positive for abdominal pain.  ?Genitourinary:  Negative for flank pain.  ?Musculoskeletal:  Negative for back pain.  ?Skin:  Negative for color change and rash.  ?Neurological:  Negative for syncope.  ?All other systems reviewed and are negative. ? ?Physical Exam ?Updated Vital Signs ?BP 127/82   Pulse (!) 105   Temp 98 ?F (36.7 ?C) (Oral)   Resp 17   Wt 75.8 kg  SpO2 95%   BMI 26.17 kg/m?  ?Physical Exam ?Constitutional:   ?   Appearance: He is well-developed.  ?HENT:  ?   Head: Normocephalic.  ?   Nose: Nose normal.  ?Eyes:  ?   Extraocular Movements: Extraocular movements intact.  ?Cardiovascular:  ?   Rate and Rhythm: Normal rate.  ?Pulmonary:  ?   Effort: Pulmonary effort is normal.  ?Abdominal:  ?   Comments: Tenderness in epigastric region.  Mild guarding no rebound. ? ?5 x 5 cm protruding periumbilical hernia, unable to be reduced and moderately tender on palpation.  ?Skin: ?   Coloration: Skin is not jaundiced.  ?Neurological:  ?   Mental Status: He is alert. Mental status is at baseline.  ? ? ?ED Results / Procedures / Treatments   ?Labs ?(all labs ordered are listed,  but only abnormal results are displayed) ?Labs Reviewed  ?CBC WITH DIFFERENTIAL/PLATELET - Abnormal; Notable for the following components:  ?    Result Value  ? Hemoglobin 12.3 (*)   ? HCT 38.7 (*)   ? MCV 78.5 (*)   ? MCH 24.9 (*)   ? RDW 18.2 (*)   ? All other components within normal limits  ?COMPREHENSIVE METABOLIC PANEL - Abnormal; Notable for the following components:  ? Sodium 132 (*)   ? Chloride 94 (*)   ? Glucose, Bld 358 (*)   ? BUN 62 (*)   ? Creatinine, Ser 1.99 (*)   ? Albumin 2.9 (*)   ? Alkaline Phosphatase 239 (*)   ? GFR, Estimated 34 (*)   ? All other components within normal limits  ?URINALYSIS, ROUTINE W REFLEX MICROSCOPIC - Abnormal; Notable for the following components:  ? Glucose, UA >=500 (*)   ? Hgb urine dipstick TRACE (*)   ? All other components within normal limits  ?PROTIME-INR - Abnormal; Notable for the following components:  ? Prothrombin Time 15.3 (*)   ? All other components within normal limits  ?URINALYSIS, MICROSCOPIC (REFLEX) - Abnormal; Notable for the following components:  ? Bacteria, UA FEW (*)   ? All other components within normal limits  ?LIPASE, BLOOD  ?LACTIC ACID, PLASMA  ?AMMONIA  ?TROPONIN I (HIGH SENSITIVITY)  ? ? ?EKG ?EKG Interpretation ? ?Date/Time:  Sunday June 23 2021 10:04:52 EDT ?Ventricular Rate:  102 ?PR Interval:  94 ?QRS Duration: 94 ?QT Interval:  359 ?QTC Calculation: 468 ?R Axis:   34 ?Text Interpretation: Sinus tachycardia Multiple ventricular premature complexes Confirmed by Thamas Jaegers ([id number]) on 06/23/2021 10:12:37 AM ? ?Radiology ?CT ABDOMEN PELVIS WO CONTRAST ? ?Result Date: 06/23/2021 ?CLINICAL DATA:  Nonlocalized abdominal pain. EXAM: CT ABDOMEN AND PELVIS WITHOUT CONTRAST TECHNIQUE: Multidetector CT imaging of the abdomen and pelvis was performed following the standard protocol without IV contrast. RADIATION DOSE REDUCTION: This exam was performed according to the departmental dose-optimization program which includes automated exposure  control, adjustment of the mA and/or kV according to patient size and/or use of iterative reconstruction technique. COMPARISON:  07/03/2020 FINDINGS: Lower chest: Scar noted within the left lower lobe. No acute abnormality. Hepatobiliary: Advanced changes of cirrhosis. The liver has a diffusely nodular contour and there is relative hypertrophy of both caudate and lateral segment of left hepatic lobe. Within the limitations of unenhanced technique no focal liver abnormality identified. Multiple gallstones are identified. The largest measures 1.7 cm long axis. Gallbladder appears partially decompressed. Pancreas: Unremarkable. No pancreatic ductal dilatation or surrounding inflammatory changes. Spleen: Splenomegaly. Adrenals/Urinary Tract: Normal adrenal glands. Bilateral nephrolithiasis.  The largest stone is in the inferior pole of the left kidney measuring 1 cm. Bilateral Bosniak class 1 cysts are identified. The largest is in the inferior pole of the right kidney measuring 3.4 cm. No follow-up recommended. Bladder appears normal. Stomach/Bowel: Evaluation of bowel pathology is limited due to lack of IV and enteric contrast material. There is gas and fluid distension of the gastric lumen. Multiple dilated proximal small bowel loops are identified with air-fluid levels. These measure up to 4.2 cm in diameter. The distal small bowel loops appear decreased in caliber. Findings are compatible with small bowel obstruction with transition point within a large umbilical hernia which contains fluid and dilated small bowel. The colon is unremarkable. Vascular/Lymphatic: Extensive aortic atherosclerosis with branch vessel disease. No aneurysm. Left upper quadrant scratch set upper abdominal varicosities. No signs of abdominopelvic adenopathy. Reproductive: Prostate is unremarkable. Other: Moderate volume of ascites identified within the abdomen and pelvis. No discrete fluid collections. No signs of pneumoperitoneum.  Musculoskeletal: No acute or suspicious osseous findings. Lumbar spondylosis identified. IMPRESSION: 1. Examination is limited due to lack of IV and enteric contrast material. 2. Small bowel obstruction with transition poin

## 2021-06-23 NOTE — ED Notes (Signed)
[facility] transfer line at 2:10 ?

## 2021-06-25 LAB — PATHOLOGIST SMEAR REVIEW

## 2021-06-26 LAB — CULTURE, BODY FLUID W GRAM STAIN -BOTTLE: Culture: NO GROWTH

## 2021-07-09 ENCOUNTER — Other Ambulatory Visit: Payer: Self-pay

## 2021-07-09 ENCOUNTER — Other Ambulatory Visit (HOSPITAL_COMMUNITY): Payer: Self-pay

## 2021-07-12 ENCOUNTER — Ambulatory Visit (HOSPITAL_COMMUNITY)
Admission: RE | Admit: 2021-07-12 | Discharge: 2021-07-12 | Disposition: A | Discharge status: Home / Self Care | Payer: 59 | Source: Ambulatory Visit | Attending: Gastroenterology | Admitting: Gastroenterology

## 2021-07-12 DIAGNOSIS — R188 Other ascites: Secondary | ICD-10-CM

## 2021-07-12 HISTORY — PX: IR PARACENTESIS: IMG2679

## 2021-07-12 LAB — GRAM STAIN: Gram Stain: NONE SEEN

## 2021-07-12 LAB — PROTEIN, PLEURAL OR PERITONEAL FLUID: Total protein, fluid: <3 g/dL

## 2021-07-12 LAB — BODY FLUID CELL COUNT WITH DIFFERENTIAL
Eos, Fluid: 0 %
Lymphs, Fluid: 25 %
Monocyte-Macrophage-Serous Fluid: 72 % (ref 50–90)
Neutrophil Count, Fluid: 3 % (ref 0–25)
Total Nucleated Cell Count, Fluid: 36 cu mm (ref 0–1000)

## 2021-07-12 LAB — ALBUMIN, PLEURAL OR PERITONEAL FLUID: Albumin, Fluid: <1.5 g/dL

## 2021-07-12 MED ORDER — LIDOCAINE HCL 1 % IJ SOLN
INTRAMUSCULAR | Status: AC
  Filled 2021-07-12: qty 20

## 2021-07-12 NOTE — Procedures (Signed)
PROCEDURE SUMMARY:  Successful US guided paracentesis from right lateral abdomen.  Yielded 3.6 liters of yellow fluid.  No immediate complications.  Pt tolerated well.   Specimen was sent for labs.  EBL < 37mL  Hoyt Koch PA-C 07/12/2021 3:51 PM

## 2021-07-16 LAB — PATHOLOGIST SMEAR REVIEW

## 2021-07-17 LAB — CULTURE, BODY FLUID W GRAM STAIN -BOTTLE: Culture: NO GROWTH

## 2021-07-26 ENCOUNTER — Ambulatory Visit (HOSPITAL_COMMUNITY)
Admission: RE | Admit: 2021-07-26 | Discharge: 2021-07-26 | Disposition: A | Discharge status: Home / Self Care | Payer: 59 | Source: Ambulatory Visit | Attending: Gastroenterology | Admitting: Gastroenterology

## 2021-07-26 DIAGNOSIS — R188 Other ascites: Secondary | ICD-10-CM | POA: Diagnosis present

## 2021-07-26 HISTORY — PX: IR PARACENTESIS: IMG2679

## 2021-07-26 LAB — ALBUMIN, PLEURAL OR PERITONEAL FLUID: Albumin, Fluid: <1.5 g/dL

## 2021-07-26 LAB — GRAM STAIN

## 2021-07-26 LAB — PROTEIN, PLEURAL OR PERITONEAL FLUID: Total protein, fluid: <3 g/dL

## 2021-07-26 LAB — BODY FLUID CELL COUNT WITH DIFFERENTIAL
Eos, Fluid: 0 %
Lymphs, Fluid: 53 %
Monocyte-Macrophage-Serous Fluid: 44 % — ABNORMAL LOW (ref 50–90)
Neutrophil Count, Fluid: 3 % (ref 0–25)
Total Nucleated Cell Count, Fluid: 164 cu mm (ref 0–1000)

## 2021-07-26 MED ORDER — ALBUMIN HUMAN 25 % IV SOLN: 50.0000 g | Freq: Once | INTRAVENOUS | Status: AC

## 2021-07-26 MED ORDER — LIDOCAINE HCL 1 % IJ SOLN
INTRAMUSCULAR | Status: AC
  Administered 2021-07-26: 10 mL
  Filled 2021-07-26: qty 20

## 2021-07-26 MED ORDER — ALBUMIN HUMAN 25 % IV SOLN
INTRAVENOUS | Status: AC
  Administered 2021-07-26: 50 g via INTRAVENOUS
  Filled 2021-07-26: qty 200

## 2021-07-26 NOTE — Procedures (Signed)
PROCEDURE SUMMARY:  Successful ultrasound guided paracentesis from the RLQ.  Yielded 5.7L of yellow ascitic fluid.  No immediate complications.  The patient tolerated the procedure well.   Specimen was sent for labs.  EBL < 62mL  The patient has required >/=2 paracenteses in a 30 day period and a screening evaluation by the [facility] has been arranged.   Electronically Signed: Sheliah Plane, PA 06/06/2021, 2:42 PM

## 2021-07-29 LAB — PATHOLOGIST SMEAR REVIEW

## 2021-07-31 LAB — CULTURE, BODY FLUID W GRAM STAIN -BOTTLE: Culture: NO GROWTH

## 2021-08-01 ENCOUNTER — Other Ambulatory Visit: Payer: Self-pay

## 2021-08-01 ENCOUNTER — Other Ambulatory Visit: Payer: Self-pay | Admitting: Pharmacist

## 2021-08-01 MED ORDER — ACCU-CHEK SOFTCLIX LANCETS MISC
0 refills | Status: DC
  Filled 2021-08-01: qty 100, 50d supply, fill #0

## 2021-08-01 MED ORDER — ACCU-CHEK GUIDE W/DEVICE KIT
PACK | 0 refills | Status: DC
  Filled 2021-08-01: qty 1, 30d supply, fill #0

## 2021-08-01 MED ORDER — ACCU-CHEK GUIDE VI STRP
ORAL_STRIP | 0 refills | Status: DC
  Filled 2021-08-01: qty 100, 50d supply, fill #0
  Filled 2021-08-01: qty 100, fill #0

## 2021-08-02 ENCOUNTER — Other Ambulatory Visit: Payer: Self-pay

## 2021-08-07 ENCOUNTER — Other Ambulatory Visit (HOSPITAL_COMMUNITY): Payer: Self-pay

## 2021-08-15 ENCOUNTER — Other Ambulatory Visit (HOSPITAL_COMMUNITY): Payer: Self-pay

## 2021-08-15 ENCOUNTER — Other Ambulatory Visit: Payer: Self-pay | Admitting: Family Medicine

## 2021-08-15 ENCOUNTER — Other Ambulatory Visit: Payer: Self-pay | Admitting: Gastroenterology

## 2021-08-15 ENCOUNTER — Other Ambulatory Visit (HOSPITAL_COMMUNITY): Payer: Self-pay | Admitting: Gastroenterology

## 2021-08-15 DIAGNOSIS — K7460 Unspecified cirrhosis of liver: Secondary | ICD-10-CM

## 2021-08-15 DIAGNOSIS — R188 Other ascites: Secondary | ICD-10-CM

## 2021-08-16 ENCOUNTER — Other Ambulatory Visit (HOSPITAL_COMMUNITY): Payer: Self-pay

## 2021-08-19 ENCOUNTER — Other Ambulatory Visit (HOSPITAL_COMMUNITY): Payer: Self-pay

## 2021-09-03 ENCOUNTER — Other Ambulatory Visit (HOSPITAL_COMMUNITY): Payer: Self-pay

## 2021-10-16 ENCOUNTER — Other Ambulatory Visit (HOSPITAL_COMMUNITY): Payer: Self-pay

## 2021-11-19 ENCOUNTER — Other Ambulatory Visit (HOSPITAL_COMMUNITY): Payer: Self-pay

## 2022-04-25 DEATH — deceased

## 2023-08-10 IMAGING — US IR PARACENTESIS
2 series · 3 of 3 positions shown · non-contrast
Comparison: none

INDICATION: Patient with history of cirrhosis. Request made for diagnostic and
therapeutic paracentesis

[Series 1: ir (person_name)/(person_name) · 2 of 2 slices shown (1 of 2)]
[im 1/2]
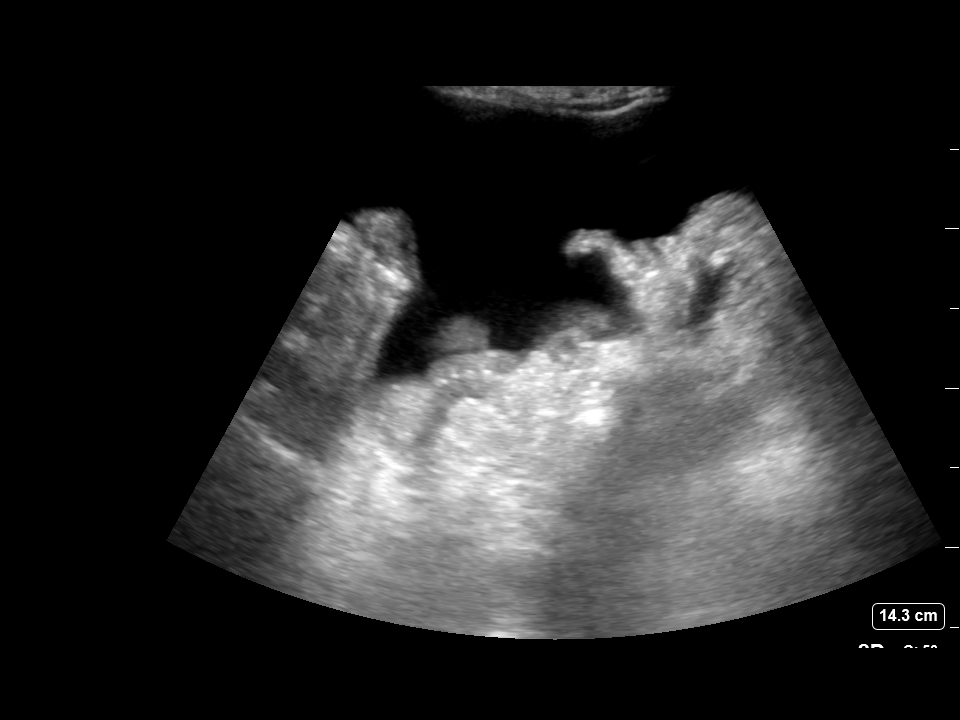
[im 2/2]
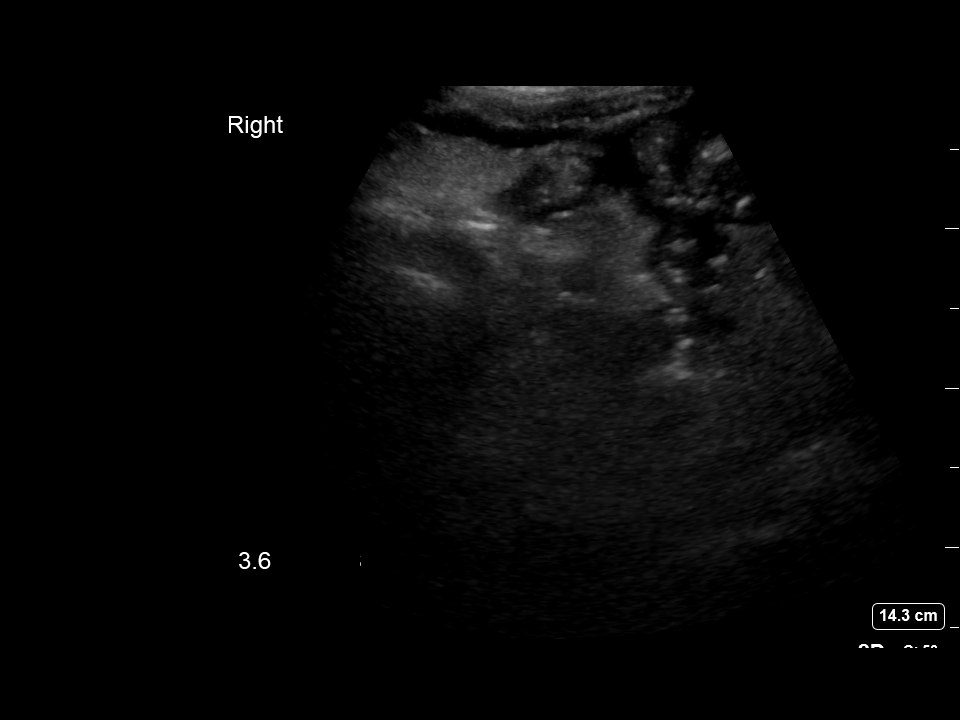

[Series 1: ir (person_name)/(person_name) · 1 of 1 slices shown (2 of 2)]
[im 1/1]
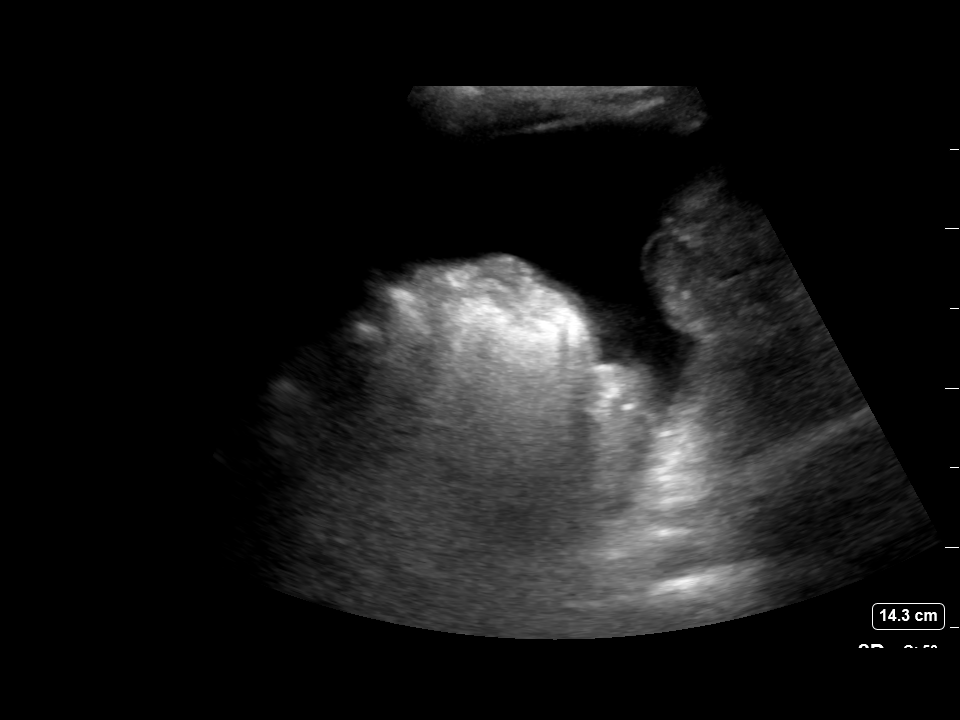

[3 of 3 positions shown; findings below may reference images not displayed]

EXAM:
ULTRASOUND GUIDED THORACENTESIS PARACENTESIS

MEDICATIONS:
10 mL 1% lidocaine.

COMPLICATIONS:
None immediate.

PROCEDURE:
Informed written consent was obtained from the patient after a
discussion of the risks, benefits and alternatives to treatment. A
timeout was performed prior to the initiation of the procedure.

Initial ultrasound scanning demonstrates a moderate amount of
ascites within the right lower abdominal quadrant. The right lower
abdomen was prepped and draped in the usual sterile fashion. 1%
lidocaine was used for local anesthesia.

Following this, a 19 gauge, 7-cm, Yueh catheter was introduced. An
ultrasound image was saved for documentation purposes. The
paracentesis was performed. The catheter was removed and a dressing
was applied. The patient tolerated the procedure well without
immediate post procedural complication.
Patient received post-procedure intravenous albumin; see nursing
notes for details.
FINDINGS: A total of approximately 3.6 liters of yellow fluid was removed.
Samples were sent to the laboratory as requested by the clinical
team.
IMPRESSION: Successful ultrasound-guided paracentesis yielding 3.6 liters of
peritoneal fluid.

## 2023-08-24 IMAGING — US IR PARACENTESIS
1 series · 3 of 3 positions shown · non-contrast
Comparison: none

INDICATION: History of cirrhosis, recurrent ascites

[Series 1: ir (person_name)/(person_name) · 3 of 3 slices shown]
[im 1/3]
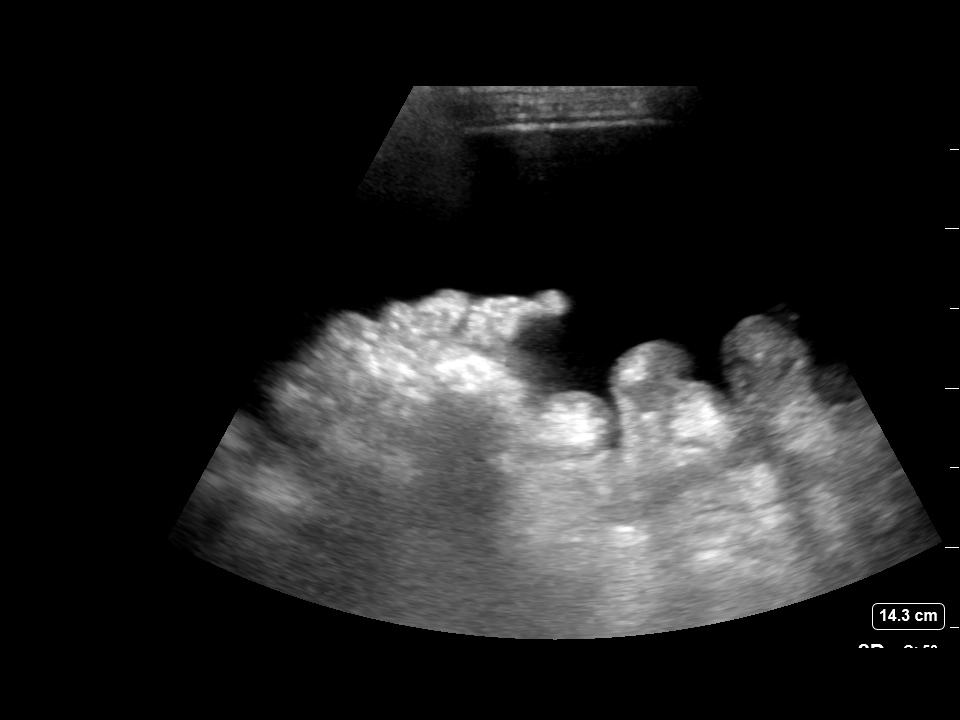
[im 2/3]
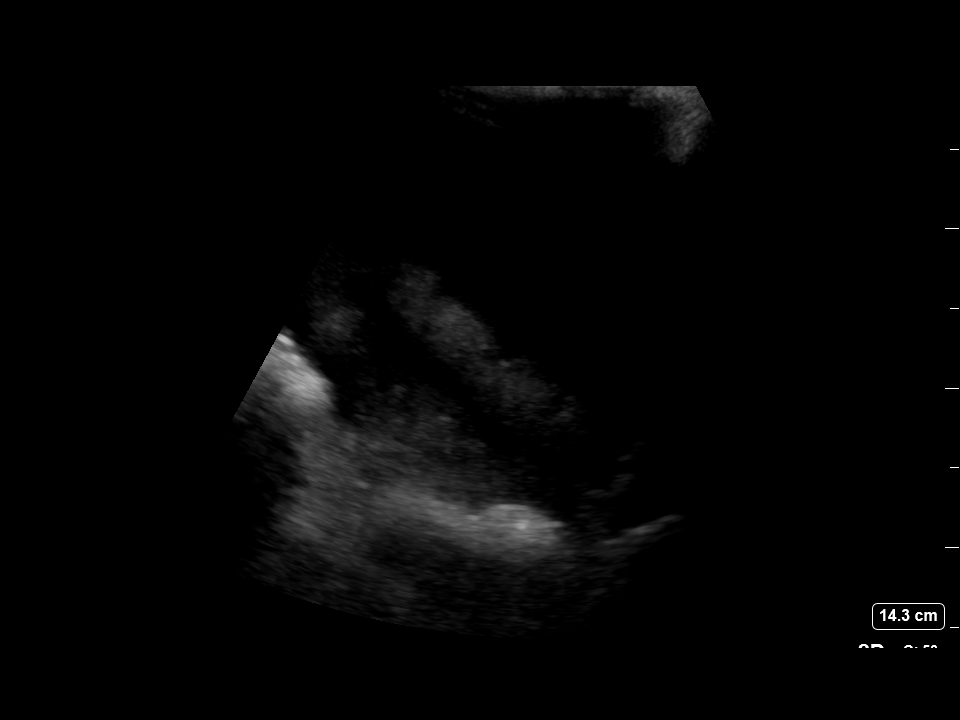
[im 3/3]
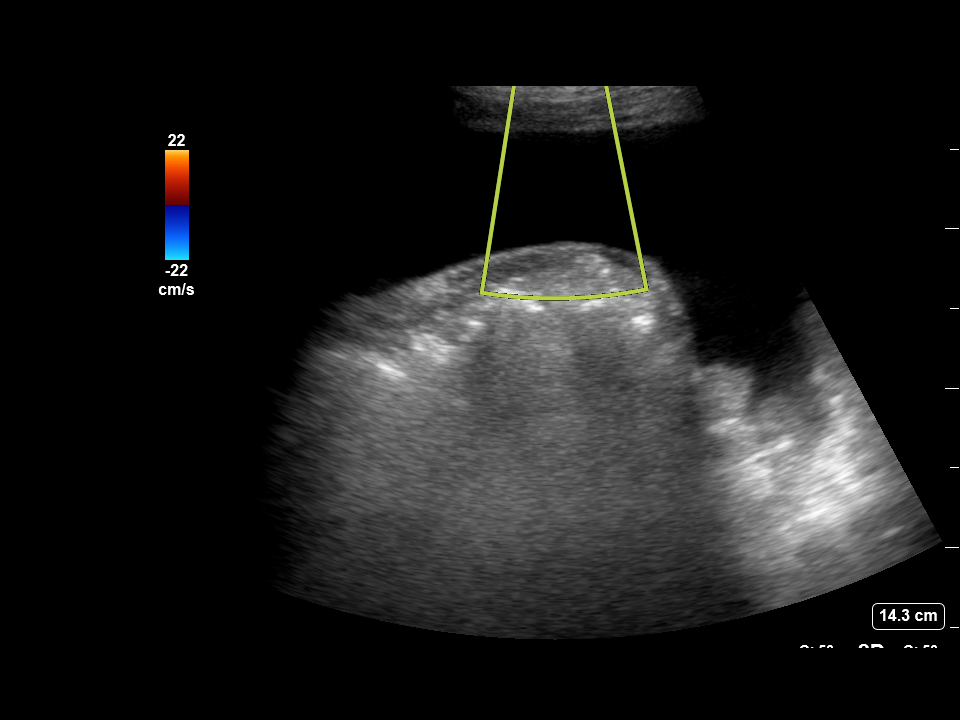

[3 of 3 positions shown; findings below may reference images not displayed]

EXAM:
ULTRASOUND GUIDED RLQ PARACENTESIS

MEDICATIONS:
None.

COMPLICATIONS:
None immediate.

PROCEDURE:
Informed written consent was obtained from the patient after a
discussion of the risks, benefits and alternatives to treatment. A
timeout was performed prior to the initiation of the procedure.

Initial ultrasound scanning demonstrates a large amount of ascites
within the right lower abdominal quadrant. The right lower abdomen
was prepped and draped in the usual sterile fashion. 1% lidocaine
was used for local anesthesia.

Following this, a 19 gauge, 7-cm, Yueh catheter was introduced. An
ultrasound image was saved for documentation purposes. The
paracentesis was performed. The catheter was removed and a dressing
was applied. The patient tolerated the procedure well without
immediate post procedural complication.
Patient received post-procedure intravenous albumin; see nursing
notes for details.
FINDINGS: A total of approximately 5.7L of clear yellow ascitic fluid was
removed. Samples were sent to the laboratory as requested by the
clinical team.
IMPRESSION: Successful ultrasound-guided paracentesis yielding 5.7 liters of
peritoneal fluid.

PLAN:
The patient has required >/=2 paracenteses in a 30 day period and a
Hypertension Clinic has been arranged.
